# Patient Record
Sex: Female | Born: 1965
Health system: Southern US, Community
[De-identification: ages and names within clinical notes are randomized; demographics above are authoritative.]

## PROBLEM LIST (undated history)

## (undated) DIAGNOSIS — E7800 Pure hypercholesterolemia, unspecified: Secondary | ICD-10-CM

## (undated) DIAGNOSIS — I6523 Occlusion and stenosis of bilateral carotid arteries: Secondary | ICD-10-CM

## (undated) DIAGNOSIS — A6920 Lyme disease, unspecified: Secondary | ICD-10-CM

## (undated) DIAGNOSIS — I2510 Atherosclerotic heart disease of native coronary artery without angina pectoris: Secondary | ICD-10-CM

## (undated) HISTORY — PX: COLONOSCOPY: SHX174

## (undated) HISTORY — PX: WISDOM TOOTH EXTRACTION: SHX21

## (undated) HISTORY — DX: Occlusion and stenosis of bilateral carotid arteries: I65.23

## (undated) HISTORY — DX: Atherosclerotic heart disease of native coronary artery without angina pectoris: I25.10

## (undated) HISTORY — PX: TUBAL LIGATION: SHX77

## (undated) HISTORY — DX: Lyme disease, unspecified: A69.20

---

## 1998-11-30 ENCOUNTER — Other Ambulatory Visit: Admission: RE | Admit: 1998-11-30 | Discharge: 1998-11-30 | Payer: Self-pay | Admitting: Gynecology

## 2000-06-23 ENCOUNTER — Other Ambulatory Visit: Admission: RE | Admit: 2000-06-23 | Discharge: 2000-06-23 | Payer: Self-pay | Admitting: Gynecology

## 2001-12-20 ENCOUNTER — Other Ambulatory Visit: Admission: RE | Admit: 2001-12-20 | Discharge: 2001-12-20 | Payer: Self-pay | Admitting: Gynecology

## 2003-05-16 ENCOUNTER — Other Ambulatory Visit: Admission: RE | Admit: 2003-05-16 | Discharge: 2003-05-16 | Payer: Self-pay | Admitting: Gynecology

## 2004-11-23 ENCOUNTER — Ambulatory Visit: Payer: Self-pay | Admitting: Family Medicine

## 2005-03-14 ENCOUNTER — Emergency Department (HOSPITAL_COMMUNITY): Admission: EM | Admit: 2005-03-14 | Discharge: 2005-03-15 | Payer: Self-pay | Admitting: Emergency Medicine

## 2005-05-26 ENCOUNTER — Other Ambulatory Visit: Admission: RE | Admit: 2005-05-26 | Discharge: 2005-05-26 | Payer: Self-pay | Admitting: Gynecology

## 2005-10-24 ENCOUNTER — Ambulatory Visit: Payer: Self-pay | Admitting: Family Medicine

## 2005-12-01 ENCOUNTER — Ambulatory Visit: Payer: Self-pay | Admitting: Family Medicine

## 2008-05-24 ENCOUNTER — Emergency Department (HOSPITAL_COMMUNITY): Admission: EM | Admit: 2008-05-24 | Discharge: 2008-05-24 | Payer: Self-pay | Admitting: Family Medicine

## 2008-05-29 ENCOUNTER — Other Ambulatory Visit: Admission: RE | Admit: 2008-05-29 | Discharge: 2008-05-29 | Payer: Self-pay | Admitting: Gynecology

## 2008-05-29 ENCOUNTER — Encounter: Payer: Self-pay | Admitting: Women's Health

## 2008-05-29 ENCOUNTER — Ambulatory Visit: Payer: Self-pay | Admitting: Women's Health

## 2012-07-20 ENCOUNTER — Other Ambulatory Visit (HOSPITAL_COMMUNITY)
Admission: RE | Admit: 2012-07-20 | Discharge: 2012-07-20 | Disposition: A | Discharge status: Home / Self Care | Payer: BC Managed Care – PPO | Source: Ambulatory Visit | Attending: Women's Health | Admitting: Women's Health

## 2012-07-20 ENCOUNTER — Ambulatory Visit (INDEPENDENT_AMBULATORY_CARE_PROVIDER_SITE_OTHER): Payer: BC Managed Care – PPO | Admitting: Women's Health

## 2012-07-20 ENCOUNTER — Encounter: Payer: Self-pay | Admitting: Women's Health

## 2012-07-20 VITALS — BP 150/72 | Ht 63.0 in | Wt 132.0 lb

## 2012-07-20 DIAGNOSIS — Z01419 Encounter for gynecological examination (general) (routine) without abnormal findings: Secondary | ICD-10-CM | POA: Insufficient documentation

## 2012-07-20 DIAGNOSIS — A6920 Lyme disease, unspecified: Secondary | ICD-10-CM | POA: Insufficient documentation

## 2012-07-20 DIAGNOSIS — E079 Disorder of thyroid, unspecified: Secondary | ICD-10-CM

## 2012-07-20 DIAGNOSIS — Z833 Family history of diabetes mellitus: Secondary | ICD-10-CM

## 2012-07-20 DIAGNOSIS — Z1151 Encounter for screening for human papillomavirus (HPV): Secondary | ICD-10-CM | POA: Insufficient documentation

## 2012-07-20 DIAGNOSIS — F17200 Nicotine dependence, unspecified, uncomplicated: Secondary | ICD-10-CM

## 2012-07-20 DIAGNOSIS — F17210 Nicotine dependence, cigarettes, uncomplicated: Secondary | ICD-10-CM | POA: Insufficient documentation

## 2012-07-20 DIAGNOSIS — Z13220 Encounter for screening for lipoid disorders: Secondary | ICD-10-CM

## 2012-07-20 LAB — CBC WITH DIFFERENTIAL/PLATELET
Basophils Absolute: 0.1 10*3/uL (ref 0.0–0.1)
Basophils Relative: 1 % (ref 0–1)
Eosinophils Absolute: 0.2 10*3/uL (ref 0.0–0.7)
Eosinophils Relative: 2 % (ref 0–5)
HCT: 39.4 % (ref 36.0–46.0)
Hemoglobin: 13.7 g/dL (ref 12.0–15.0)
Lymphocytes Relative: 26 % (ref 12–46)
Lymphs Abs: 2.1 10*3/uL (ref 0.7–4.0)
MCH: 31.6 pg (ref 26.0–34.0)
MCHC: 34.8 g/dL (ref 30.0–36.0)
MCV: 90.8 fL (ref 78.0–100.0)
Monocytes Absolute: 0.5 10*3/uL (ref 0.1–1.0)
Monocytes Relative: 7 % (ref 3–12)
Neutro Abs: 5.1 10*3/uL (ref 1.7–7.7)
Neutrophils Relative %: 64 % (ref 43–77)
Platelets: 231 10*3/uL (ref 150–400)
RBC: 4.34 MIL/uL (ref 3.87–5.11)
RDW: 13.4 % (ref 11.5–15.5)
WBC: 7.9 10*3/uL (ref 4.0–10.5)

## 2012-07-20 LAB — LIPID PANEL
Cholesterol: 175 mg/dL (ref 0–200)
HDL: 60 mg/dL (ref >39)
LDL Cholesterol: 97 mg/dL (ref 0–99)
Total CHOL/HDL Ratio: 2.9 Ratio
Triglycerides: 90 mg/dL (ref <150)
VLDL: 18 mg/dL (ref 0–40)

## 2012-07-20 LAB — GLUCOSE, RANDOM: Glucose, Bld: 80 mg/dL (ref 70–99)

## 2012-07-20 LAB — TSH: TSH: 1.187 u[IU]/mL (ref 0.350–4.500)

## 2012-07-20 NOTE — Patient Instructions (Signed)
Vit d 1000 Health Maintenance, Females A healthy lifestyle and preventative care can promote health and wellness.  Maintain regular health, dental, and eye exams.  Eat a healthy diet. Foods like vegetables, fruits, whole grains, low-fat dairy products, and lean protein foods contain the nutrients you need without too many calories. Decrease your intake of foods high in solid fats, added sugars, and salt. Get information about a proper diet from your caregiver, if necessary.  Regular physical exercise is one of the most important things you can do for your health. Most adults should get at least 150 minutes of moderate-intensity exercise (any activity that increases your heart rate and causes you to sweat) each week. In addition, most adults need muscle-strengthening exercises on 2 or more days a week.   Maintain a healthy weight. The body mass index (BMI) is a screening tool to identify possible weight problems. It provides an estimate of body fat based on height and weight. Your caregiver can help determine your BMI, and can help you achieve or maintain a healthy weight. For adults 20 years and older:  A BMI below 18.5 is considered underweight.  A BMI of 18.5 to 24.9 is normal.  A BMI of 25 to 29.9 is considered overweight.  A BMI of 30 and above is considered obese.  Maintain normal blood lipids and cholesterol by exercising and minimizing your intake of saturated fat. Eat a balanced diet with plenty of fruits and vegetables. Blood tests for lipids and cholesterol should begin at age 74 and be repeated every 5 years. If your lipid or cholesterol levels are high, you are over 50, or you are a high risk for heart disease, you may need your cholesterol levels checked more frequently.Ongoing high lipid and cholesterol levels should be treated with medicines if diet and exercise are not effective.  If you smoke, find out from your caregiver how to quit. If you do not use tobacco, do not  start.  If you are pregnant, do not drink alcohol. If you are breastfeeding, be very cautious about drinking alcohol. If you are not pregnant and choose to drink alcohol, do not exceed 1 drink per day. One drink is considered to be 12 ounces (355 mL) of beer, 5 ounces (148 mL) of wine, or 1.5 ounces (44 mL) of liquor.  Avoid use of street drugs. Do not share needles with anyone. Ask for help if you need support or instructions about stopping the use of drugs.  High blood pressure causes heart disease and increases the risk of stroke. Blood pressure should be checked at least every 1 to 2 years. Ongoing high blood pressure should be treated with medicines, if weight loss and exercise are not effective.  If you are 31 to 47 years old, ask your caregiver if you should take aspirin to prevent strokes.  Diabetes screening involves taking a blood sample to check your fasting blood sugar level. This should be done once every 3 years, after age 43, if you are within normal weight and without risk factors for diabetes. Testing should be considered at a younger age or be carried out more frequently if you are overweight and have at least 1 risk factor for diabetes.  Breast cancer screening is essential preventative care for women. You should practice "breast self-awareness." This means understanding the normal appearance and feel of your breasts and may include breast self-examination. Any changes detected, no matter how small, should be reported to a caregiver. Women in their 31s and  should have a clinical breast exam (CBE) by a caregiver as part of a regular health exam every 1 to 3 years. After age 40, women should have a CBE every year. Starting at age 40, women should consider having a mammogram (breast X-ray) every year. Women who have a family history of breast cancer should talk to their caregiver about genetic screening. Women at a high risk of breast cancer should talk to their caregiver about having  an MRI and a mammogram every year.  The Pap test is a screening test for cervical cancer. Women should have a Pap test starting at age 21. Between ages 21 and 29, Pap tests should be repeated every 2 years. Beginning at age 30, you should have a Pap test every 3 years as long as the past 3 Pap tests have been normal. If you had a hysterectomy for a problem that was not cancer or a condition that could lead to cancer, then you no longer need Pap tests. If you are between ages 65 and 70, and you have had normal Pap tests going back 10 years, you no longer need Pap tests. If you have had past treatment for cervical cancer or a condition that could lead to cancer, you need Pap tests and screening for cancer for at least 20 years after your treatment. If Pap tests have been discontinued, risk factors (such as a new sexual partner) need to be reassessed to determine if screening should be resumed. Some women have medical problems that increase the chance of getting cervical cancer. In these cases, your caregiver may recommend more frequent screening and Pap tests.  The human papillomavirus (HPV) test is an additional test that may be used for cervical cancer screening. The HPV test looks for the virus that can cause the cell changes on the cervix. The cells collected during the Pap test can be tested for HPV. The HPV test could be used to screen women aged 30 years and older, and should be used in women of any age who have unclear Pap test results. After the age of 30, women should have HPV testing at the same frequency as a Pap test.  Colorectal cancer can be detected and often prevented. Most routine colorectal cancer screening begins at the age of 50 and continues through age 75. However, your caregiver may recommend screening at an earlier age if you have risk factors for colon cancer. On a yearly basis, your caregiver may provide home test kits to check for hidden blood in the stool. Use of a small camera at  the end of a tube, to directly examine the colon (sigmoidoscopy or colonoscopy), can detect the earliest forms of colorectal cancer. Talk to your caregiver about this at age 50, when routine screening begins. Direct examination of the colon should be repeated every 5 to 10 years through age 75, unless early forms of pre-cancerous polyps or small growths are found.  Hepatitis C blood testing is recommended for all people born from 1945 through 1965 and any individual with known risks for hepatitis C.  Practice safe sex. Use condoms and avoid high-risk sexual practices to reduce the spread of sexually transmitted infections (STIs). Sexually active women aged 25 and younger should be checked for Chlamydia, which is a common sexually transmitted infection. Older women with new or multiple partners should also be tested for Chlamydia. Testing for other STIs is recommended if you are sexually active and at increased risk.  Osteoporosis is a disease in   which the bones lose minerals and strength with aging. This can result in serious bone fractures. The risk of osteoporosis can be identified using a bone density scan. Women ages 65 and over and women at risk for fractures or osteoporosis should discuss screening with their caregivers. Ask your caregiver whether you should be taking a calcium supplement or vitamin D to reduce the rate of osteoporosis.  Menopause can be associated with physical symptoms and risks. Hormone replacement therapy is available to decrease symptoms and risks. You should talk to your caregiver about whether hormone replacement therapy is right for you.  Use sunscreen with a sun protection factor (SPF) of 30 or greater. Apply sunscreen liberally and repeatedly throughout the day. You should seek shade when your shadow is shorter than you. Protect yourself by wearing long sleeves, pants, a wide-brimmed hat, and sunglasses year round, whenever you are outdoors.  Notify your caregiver of new  moles or changes in moles, especially if there is a change in shape or color. Also notify your caregiver if a mole is larger than the size of a pencil eraser.  Stay current with your immunizations. Document Released: 03/21/2011 Document Revised: 11/28/2011 Document Reviewed: 03/21/2011 Exit[employer] Patient Information 2013 [employer].  

## 2012-07-20 NOTE — Progress Notes (Signed)
Robin Bowers [birth year] [id number]    History:    The patient presents for annual exam.  Monthly 6 day cycle/BTL/not sexually active. Divorced, no infidelity. History of normal Paps. Last Pap 2009. Last mammogram several years ago at a screening was without insurance, reports normal. Smoker.   Past medical history, past surgical history, family history and social history were all reviewed and documented in the EPIC chart. Daughter Robin Bowers 20,  Robin Bowers 16, both doing well and  had gardasil. Has had counseling currently on Wellbutrin per primary care. Has horses.   ROS:  A  ROS was performed and pertinent positives and negatives are included in the history.  Exam:  Filed Vitals:   07/20/12 0801  BP: 150/72    General appearance:  Normal Head/Neck:  Normal, without cervical or supraclavicular adenopathy. Thyroid:  Symmetrical, normal in size, without palpable masses or nodularity. Respiratory  Effort:  Normal  Auscultation:  Clear without wheezing or rhonchi Cardiovascular  Auscultation:  Regular rate, without rubs, murmurs or gallops  Edema/varicosities:  Not grossly evident Abdominal  Soft,nontender, without masses, guarding or rebound.  Liver/spleen:  No organomegaly noted  Hernia:  None appreciated  Skin  Inspection:  Grossly normal  Palpation:  Grossly normal Neurologic/psychiatric  Orientation:  Normal with appropriate conversation.  Mood/affect:  Normal  Genitourinary    Breasts: Examined lying and sitting.     Right: Without masses, retractions, discharge or axillary adenopathy.     Left: Without masses, retractions, discharge or axillary adenopathy.   Inguinal/mons:  Normal without inguinal adenopathy  External genitalia:  Normal  BUS/Urethra/Skene's glands:  Normal  Bladder:  Normal  Vagina:  Normal  Cervix:  Normal  Uterus:   normal in size, shape and contour.  Midline and mobile  Adnexa/parametria:     Rt: Without masses or tenderness.   Lt: Without  masses or tenderness.  Anus and perineum: Normal  Digital rectal exam: Normal sphincter tone without palpated masses or tenderness  Assessment/Plan:  46 y.o. DWF G2P2 for annual exam without complaint.  Normal GYN exam/BTL with minimal healthcare in past few years Situational anxiety/depression-Wellbutrin primary care Lyme disease primary care managing Smoker half pack per day  Plan: SBE's, schedule mammogram, exercise, calcium rich diet and vitamin D 1000 daily encouraged. Reviewed importance of no smoking or decreasing for health. Continue care with primary care for current treatment of Lyme disease and situational anxiety/depression. Blood pressure 150/70 reports normal blood pressure primary care will check away from office and followup as needed. CBC, glucose, lipid panel, UA, Pap. New screening guidelines reviewed. Condoms encouraged if becomes sexually active.    Huel Cote [facility], 8:48 AM 07/20/2012

## 2012-07-21 LAB — URINALYSIS W MICROSCOPIC + REFLEX CULTURE
Bacteria, UA: NONE SEEN
Bilirubin Urine: NEGATIVE
Casts: NONE SEEN
Crystals: NONE SEEN
Glucose, UA: NEGATIVE mg/dL
Hgb urine dipstick: NEGATIVE
Ketones, ur: NEGATIVE mg/dL
Leukocytes, UA: NEGATIVE
Nitrite: NEGATIVE
Protein, ur: NEGATIVE mg/dL
Specific Gravity, Urine: 1.008 (ref 1.005–1.030)
Urobilinogen, UA: 0.2 mg/dL (ref 0.0–1.0)
pH: 5.5 (ref 5.0–8.0)

## 2012-07-27 ENCOUNTER — Encounter: Payer: Self-pay | Admitting: Gynecology

## 2013-10-02 ENCOUNTER — Ambulatory Visit (INDEPENDENT_AMBULATORY_CARE_PROVIDER_SITE_OTHER): Payer: BC Managed Care – PPO | Admitting: Women's Health

## 2013-10-02 ENCOUNTER — Encounter: Payer: Self-pay | Admitting: Women's Health

## 2013-10-02 VITALS — BP 132/70 | Ht 63.0 in | Wt 132.2 lb

## 2013-10-02 DIAGNOSIS — Z113 Encounter for screening for infections with a predominantly sexual mode of transmission: Secondary | ICD-10-CM

## 2013-10-02 DIAGNOSIS — N912 Amenorrhea, unspecified: Secondary | ICD-10-CM

## 2013-10-02 DIAGNOSIS — Z01419 Encounter for gynecological examination (general) (routine) without abnormal findings: Secondary | ICD-10-CM

## 2013-10-02 DIAGNOSIS — Z833 Family history of diabetes mellitus: Secondary | ICD-10-CM

## 2013-10-02 LAB — CBC WITH DIFFERENTIAL/PLATELET
Basophils Absolute: 0 10*3/uL (ref 0.0–0.1)
Basophils Relative: 0 % (ref 0–1)
Eosinophils Absolute: 0.1 10*3/uL (ref 0.0–0.7)
Eosinophils Relative: 1 % (ref 0–5)
HCT: 42.7 % (ref 36.0–46.0)
Hemoglobin: 14.6 g/dL (ref 12.0–15.0)
Lymphocytes Relative: 27 % (ref 12–46)
Lymphs Abs: 2.5 10*3/uL (ref 0.7–4.0)
MCH: 31.5 pg (ref 26.0–34.0)
MCHC: 34.2 g/dL (ref 30.0–36.0)
MCV: 92.2 fL (ref 78.0–100.0)
Monocytes Absolute: 0.5 10*3/uL (ref 0.1–1.0)
Monocytes Relative: 5 % (ref 3–12)
Neutro Abs: 6.1 10*3/uL (ref 1.7–7.7)
Neutrophils Relative %: 67 % (ref 43–77)
Platelets: 214 10*3/uL (ref 150–400)
RBC: 4.63 MIL/uL (ref 3.87–5.11)
RDW: 13.4 % (ref 11.5–15.5)
WBC: 9.2 10*3/uL (ref 4.0–10.5)

## 2013-10-02 LAB — FOLLICLE STIMULATING HORMONE: FSH: 55.1 m[IU]/mL

## 2013-10-02 LAB — TSH: TSH: 1.049 u[IU]/mL (ref 0.350–4.500)

## 2013-10-02 LAB — GLUCOSE, RANDOM: Glucose, Bld: 89 mg/dL (ref 70–99)

## 2013-10-02 NOTE — Patient Instructions (Signed)
Health Recommendations for Postmenopausal Women Respected and ongoing research has looked at the most common causes of death, disability, and poor quality of life in postmenopausal women. The causes include heart disease, diseases of blood vessels, diabetes, depression, cancer, and bone loss (osteoporosis). Many things can be done to help lower the chances of developing these and other common problems: CARDIOVASCULAR DISEASE Heart Disease: A heart attack is a medical emergency. Know the signs and symptoms of a heart attack. Below are things women can do to reduce their risk for heart disease.   Do not smoke. If you smoke, quit.  Aim for a healthy weight. Being overweight causes many preventable deaths. Eat a healthy and balanced diet and drink an adequate amount of liquids.  Get moving. Make a commitment to be more physically active. Aim for 30 minutes of activity on most, if not all days of the week.  Eat for heart health. Choose a diet that is low in saturated fat and cholesterol and eliminate trans fat. Include whole grains, vegetables, and fruits. Read and understand the labels on food containers before buying.  Know your numbers. Ask your caregiver to check your blood pressure, cholesterol (total, HDL, LDL, triglycerides) and blood glucose. Work with your caregiver on improving your entire clinical picture.  High blood pressure. Limit or stop your table salt intake (try salt substitute and food seasonings). Avoid salty foods and drinks. Read labels on food containers before buying. Eating well and exercising can help control high blood pressure. STROKE  Stroke is a medical emergency. Stroke may be the result of a blood clot in a blood vessel in the brain or by a brain hemorrhage (bleeding). Know the signs and symptoms of a stroke. To lower the risk of developing a stroke:  Avoid fatty foods.  Quit smoking.  Control your diabetes, blood pressure, and irregular heart rate. THROMBOPHLEBITIS  (BLOOD CLOT) OF THE LEG  Becoming overweight and leading a stationary lifestyle may also contribute to developing blood clots. Controlling your diet and exercising will help lower the risk of developing blood clots. CANCER SCREENING  Breast Cancer: Take steps to reduce your risk of breast cancer.  You should practice "breast self-awareness." This means understanding the normal appearance and feel of your breasts and should include breast self-examination. Any changes detected, no matter how small, should be reported to your caregiver.  After age 40, you should have a clinical breast exam (CBE) every year.  Starting at age 40, you should consider having a mammogram (breast X-ray) every year.  If you have a family history of breast cancer, talk to your caregiver about genetic screening.  If you are at high risk for breast cancer, talk to your caregiver about having an MRI and a mammogram every year.  Intestinal or Stomach Cancer: Tests to consider are a rectal exam, fecal occult blood, sigmoidoscopy, and colonoscopy. Women who are high risk may need to be screened at an earlier age and more often.  Cervical Cancer:  Beginning at age 30, you should have a Pap test every 3 years as long as the past 3 Pap tests have been normal.  If you have had past treatment for cervical cancer or a condition that could lead to cancer, you need Pap tests and screening for cancer for at least 20 years after your treatment.  If you had a hysterectomy for a problem that was not cancer or a condition that could lead to cancer, then you no longer need Pap tests.    If you are between ages 65 and 70, and you have had normal Pap tests going back 10 years, you no longer need Pap tests.  If Pap tests have been discontinued, risk factors (such as a new sexual partner) need to be reassessed to determine if screening should be resumed.  Some medical problems can increase the chance of getting cervical cancer. In these  cases, your caregiver may recommend more frequent screening and Pap tests.  Uterine Cancer: If you have vaginal bleeding after reaching menopause, you should notify your caregiver.  Ovarian cancer: Other than yearly pelvic exams, there are no reliable tests available to screen for ovarian cancer at this time except for yearly pelvic exams.  Lung Cancer: Yearly chest X-rays can detect lung cancer and should be done on high risk women, such as cigarette smokers and women with chronic lung disease (emphysema).  Skin Cancer: A complete body skin exam should be done at your yearly examination. Avoid overexposure to the sun and ultraviolet light lamps. Use a strong sun block cream when in the sun. All of these things are important in lowering the risk of skin cancer. MENOPAUSE Menopause Symptoms: Hormone therapy products are effective for treating symptoms associated with menopause:  Moderate to severe hot flashes.  Night sweats.  Mood swings.  Headaches.  Tiredness.  Loss of sex drive.  Insomnia.  Other symptoms. Hormone replacement carries certain risks, especially in older women. Women who use or are thinking about using estrogen or estrogen with progestin treatments should discuss that with their caregiver. Your caregiver will help you understand the benefits and risks. The ideal dose of hormone replacement therapy is not known. The Food and Drug Administration (FDA) has concluded that hormone therapy should be used only at the lowest doses and for the shortest amount of time to reach treatment goals.  OSTEOPOROSIS Protecting Against Bone Loss and Preventing Fracture: If you use hormone therapy for prevention of bone loss (osteoporosis), the risks for bone loss must outweigh the risk of the therapy. Ask your caregiver about other medications known to be safe and effective for preventing bone loss and fractures. To guard against bone loss or fractures, the following is recommended:  If  you are less than age 50, take 1000 mg of calcium and at least 600 mg of Vitamin D per day.  If you are greater than age 50 but less than age 70, take 1200 mg of calcium and at least 600 mg of Vitamin D per day.  If you are greater than age 70, take 1200 mg of calcium and at least 800 mg of Vitamin D per day. Smoking and excessive alcohol intake increases the risk of osteoporosis. Eat foods rich in calcium and vitamin D and do weight bearing exercises several times a week as your caregiver suggests. DIABETES Diabetes Melitus: If you have Type I or Type 2 diabetes, you should keep your blood sugar under control with diet, exercise and recommended medication. Avoid too many sweets, starchy and fatty foods. Being overweight can make control more difficult. COGNITION AND MEMORY Cognition and Memory: Menopausal hormone therapy is not recommended for the prevention of cognitive disorders such as Alzheimer's disease or memory loss.  DEPRESSION  Depression may occur at any age, but is common in elderly women. The reasons may be because of physical, medical, social (loneliness), or financial problems and needs. If you are experiencing depression because of medical problems and control of symptoms, talk to your caregiver about this. Physical activity and   exercise may help with mood and sleep. Community and volunteer involvement may help your sense of value and worth. If you have depression and you feel that the problem is getting worse or becoming severe, talk to your caregiver about treatment options that are best for you. ACCIDENTS  Accidents are common and can be serious in the elderly woman. Prepare your house to prevent accidents. Eliminate throw rugs, place hand bars in the bath, shower and toilet areas. Avoid wearing high heeled shoes or walking on wet, snowy, and icy areas. Limit or stop driving if you have vision or hearing problems, or you feel you are unsteady with you movements and  reflexes. HEPATITIS C Hepatitis C is a type of viral infection affecting the liver. It is spread mainly through contact with blood from an infected person. It can be treated, but if left untreated, it can lead to severe liver damage over years. Many people who are infected do not know that the virus is in their blood. If you are a "baby-boomer", it is recommended that you have one screening test for Hepatitis C. IMMUNIZATIONS  Several immunizations are important to consider having during your senior years, including:   Tetanus, diptheria, and pertussis booster shot.  Influenza every year before the flu season begins.  Pneumonia vaccine.  Shingles vaccine.  Others as indicated based on your specific needs. Talk to your caregiver about these. Document Released: 10/28/2005 Document Revised: 08/22/2012 Document Reviewed: 06/23/2008 ExitCare Patient Information 2014 [employer].  

## 2013-10-02 NOTE — Progress Notes (Signed)
Robin Bowers Feb 02, 1966 [id number]    History:    The patient presents for annual exam with complaint of amenorrhea. Last monthly cycle April 2014. Denies spotting/discharge. BTL/new partner/condoms.  Smoker. Tried Wellbutrin, Chantix, nicotine patches in past without success. Experiencing some menopausal symptoms.   History of Lyme disease, managed by primary care.  Past medical history, past surgical history, family history and social history were all reviewed and documented in the EPIC chart. Daughter 86 (concerned about relationship), Son 71 doing well. Father deceased colon cancer early 28's, mother HTN, stroke, Alz. Has horses.   ROS:  A  ROS was performed and pertinent positives and negatives are included in the history.  Exam:  Filed Vitals:   10/02/13 0838  BP: 132/70    General appearance:  Normal Head/Neck:  Normal, without cervical or supraclavicular adenopathy. Thyroid:  Symmetrical, normal in size, without palpable masses or nodularity. Respiratory  Effort:  Normal  Auscultation:  Clear without wheezing or rhonchi Cardiovascular  Auscultation:  Regular rate, without rubs, murmurs or gallops  Edema/varicosities:  Not grossly evident Abdominal  Soft,nontender, without masses, guarding or rebound.  Liver/spleen:  No organomegaly noted  Hernia:  None appreciated  Skin  Inspection:  Grossly normal  Palpation:  Grossly normal Neurologic/psychiatric  Orientation:  Normal with appropriate conversation.  Mood/affect:  Normal  Genitourinary    Breasts: Examined lying and sitting.     Right: Without masses, retractions, discharge or axillary adenopathy.     Left: Without masses, retractions, discharge or axillary adenopathy.   Inguinal/mons:  Normal without inguinal adenopathy  External genitalia:  Normal  BUS/Urethra/Skene's glands:  Normal  Bladder:  Normal  Vagina:  Normal  Cervix:  Normal  Uterus:  Normal in size, shape and contour.  Midline and  mobile  Adnexa/parametria:     Rt: Without masses or tenderness.   Lt: Without masses or tenderness.  Anus and perineum: Normal  Digital rectal exam: Normal sphincter tone without palpated masses or tenderness  Assessment/Plan:  48 y.o. DWF G2P2 for annual exam.  Annual exam with  Amenorrhea/BTL New partner/STD screen Lyme Disease/managed primary care Smoker  Plan: CBC, glucose, FSH, TSH, UA, GC/Chlamydia. Declines need for HIV, hepatitis or RPR will continue condoms. Normal pap history, last pap 07/2012. New screening guidelines reviewed.  SBE's, schedule mammogram/overdue, exercise, calcium rich diet and vitamin D 2000 daily encouraged. Suggested Vit E for occasional hot flushes/sleeping difficulties. Declines HRT. Reviewed importance of no smoking or decreasing for health. Continue care with primary care for current treatment of Lyme disease.  Marland KitchenHuel Cote [facility], 9:13 AM 10/02/2013

## 2013-10-03 LAB — URINALYSIS W MICROSCOPIC + REFLEX CULTURE
Bacteria, UA: NONE SEEN
Bilirubin Urine: NEGATIVE
Casts: NONE SEEN
Crystals: NONE SEEN
Glucose, UA: NEGATIVE mg/dL
Hgb urine dipstick: NEGATIVE
Ketones, ur: NEGATIVE mg/dL
Leukocytes, UA: NEGATIVE
Nitrite: NEGATIVE
Protein, ur: NEGATIVE mg/dL
Specific Gravity, Urine: 1.014 (ref 1.005–1.030)
Squamous Epithelial / HPF: NONE SEEN
Urobilinogen, UA: 0.2 mg/dL (ref 0.0–1.0)
pH: 7 (ref 5.0–8.0)

## 2013-10-03 LAB — GC/CHLAMYDIA PROBE AMP
CT Probe RNA: NEGATIVE
GC Probe RNA: NEGATIVE

## 2014-07-21 ENCOUNTER — Encounter: Payer: Self-pay | Admitting: Women's Health

## 2016-09-05 ENCOUNTER — Other Ambulatory Visit: Payer: Self-pay | Admitting: Obstetrics and Gynecology

## 2016-09-05 DIAGNOSIS — Z1231 Encounter for screening mammogram for malignant neoplasm of breast: Secondary | ICD-10-CM

## 2016-09-22 ENCOUNTER — Ambulatory Visit (HOSPITAL_COMMUNITY)
Admission: RE | Admit: 2016-09-22 | Discharge: 2016-09-22 | Disposition: A | Discharge status: Home / Self Care | Payer: Self-pay | Source: Ambulatory Visit | Attending: Obstetrics and Gynecology | Admitting: Obstetrics and Gynecology

## 2016-09-22 ENCOUNTER — Encounter (HOSPITAL_COMMUNITY): Payer: Self-pay

## 2016-09-22 ENCOUNTER — Ambulatory Visit
Admission: RE | Admit: 2016-09-22 | Discharge: 2016-09-22 | Disposition: A | Discharge status: Home / Self Care | Payer: No Typology Code available for payment source | Source: Ambulatory Visit | Attending: Obstetrics and Gynecology | Admitting: Obstetrics and Gynecology

## 2016-09-22 VITALS — BP 124/86 | Temp 98.6°F | Ht 64.0 in | Wt 141.0 lb

## 2016-09-22 DIAGNOSIS — Z1231 Encounter for screening mammogram for malignant neoplasm of breast: Secondary | ICD-10-CM

## 2016-09-22 DIAGNOSIS — Z01419 Encounter for gynecological examination (general) (routine) without abnormal findings: Secondary | ICD-10-CM

## 2016-09-22 NOTE — Patient Instructions (Addendum)
Explained breast self awareness to [name]. Let patient know that if today's Pap smear is normal that her next Pap smear will be due in 3 years. Referred patient to the [facility] for a screening mammogram. Appointment scheduled for Thursday, September 22, 2016 at 1040. Let patient know will follow up with her within the next couple weeks with results of Pap smear by phone. Informed patient that [facility] will follow up with her within the next couple of weeks with results of mammogram by letter or phone. Discussed smoking cessation with patient. Referred to the [facility] Quitline and gave resources to free smoking cessation at [facility]. Robin Bowers verbalized understanding.  [name], [name], RN 1:37 PM

## 2016-09-22 NOTE — Progress Notes (Addendum)
No complaints today.   Pap Smear: Pap smear completed today. Last Pap smear was 07/20/2012 at [facility] and normal. Per patient has a history of an abnormal Pap smear 10 years ago that a colposcopy was completed for follow up. Per patient she has had only two Pap smears since colposcopy and both have been normal. Last Pap smear result is in EPIC.  Physical exam: Breasts Breasts symmetrical. No skin abnormalities bilateral breasts. No nipple retraction bilateral breasts. No nipple discharge bilateral breasts. No lymphadenopathy. No lumps palpated bilateral breasts. No complaints of pain or tenderness on exam. Referred patient to the [facility] for a screening mammogram. Appointment scheduled for Thursday, September 22, 2016 at 1040.  Pelvic/Bimanual   Ext Genitalia No lesions, no swelling and no discharge observed on external genitalia.         Vagina Vagina pink and normal texture. No lesions or discharge observed in vagina.          Cervix Cervix is present. Cervix pink and of normal texture. No discharge observed.     Uterus Uterus is present and palpable. Uterus in normal position and normal size.        Adnexae Bilateral ovaries present and palpable. No tenderness on palpation.          Rectovaginal No rectal exam completed today since patient had no rectal complaints. No skin abnormalities observed on exam.    Smoking History: Patient is a current smoker. Discussed smoking cessation with patient. Referred to the [facility] Quitline and gave resources to free smoking cessation at [facility].  Patient Navigation: Patient education provided. Access to services provided for patient through [facility] program.   Colorectal Cancer Screening: Per patient has never had a colonoscopy completed. No complaints today.

## 2016-09-22 NOTE — Addendum Note (Signed)
Encounter addended by: Loletta Parish, RN on: 09/22/2016  2:03 PM<BR>    Actions taken: Sign clinical note

## 2016-09-26 ENCOUNTER — Encounter (HOSPITAL_COMMUNITY): Payer: Self-pay | Admitting: *Deleted

## 2016-10-03 ENCOUNTER — Encounter (HOSPITAL_COMMUNITY): Payer: Self-pay | Admitting: *Deleted

## 2016-10-03 ENCOUNTER — Telehealth (HOSPITAL_COMMUNITY): Payer: Self-pay | Admitting: *Deleted

## 2016-10-03 NOTE — Telephone Encounter (Signed)
Telephoned patient at home number. Advised patient spoke with physician and would just need repeat pap smear in five years. Patient voiced understanding.

## 2017-10-02 DIAGNOSIS — L570 Actinic keratosis: Secondary | ICD-10-CM | POA: Diagnosis not present

## 2017-10-02 DIAGNOSIS — L821 Other seborrheic keratosis: Secondary | ICD-10-CM | POA: Diagnosis not present

## 2017-10-02 DIAGNOSIS — D229 Melanocytic nevi, unspecified: Secondary | ICD-10-CM | POA: Diagnosis not present

## 2017-10-19 DIAGNOSIS — M545 Low back pain: Secondary | ICD-10-CM | POA: Diagnosis not present

## 2017-10-19 DIAGNOSIS — M461 Sacroiliitis, not elsewhere classified: Secondary | ICD-10-CM | POA: Diagnosis not present

## 2018-01-09 ENCOUNTER — Other Ambulatory Visit: Payer: Self-pay | Admitting: Women's Health

## 2018-01-09 DIAGNOSIS — Z1231 Encounter for screening mammogram for malignant neoplasm of breast: Secondary | ICD-10-CM

## 2018-01-29 ENCOUNTER — Ambulatory Visit: Payer: No Typology Code available for payment source

## 2018-02-02 ENCOUNTER — Ambulatory Visit
Admission: RE | Admit: 2018-02-02 | Discharge: 2018-02-02 | Disposition: A | Discharge status: Home / Self Care | Payer: 59 | Source: Ambulatory Visit | Attending: Women's Health | Admitting: Women's Health

## 2018-02-02 DIAGNOSIS — Z1231 Encounter for screening mammogram for malignant neoplasm of breast: Secondary | ICD-10-CM

## 2018-02-26 DIAGNOSIS — M1711 Unilateral primary osteoarthritis, right knee: Secondary | ICD-10-CM | POA: Diagnosis not present

## 2018-02-26 DIAGNOSIS — M25562 Pain in left knee: Secondary | ICD-10-CM | POA: Diagnosis not present

## 2018-02-26 DIAGNOSIS — M25561 Pain in right knee: Secondary | ICD-10-CM | POA: Diagnosis not present

## 2018-02-26 DIAGNOSIS — M1712 Unilateral primary osteoarthritis, left knee: Secondary | ICD-10-CM | POA: Diagnosis not present

## 2018-03-14 ENCOUNTER — Ambulatory Visit: Payer: 59 | Admitting: Women's Health

## 2018-03-14 ENCOUNTER — Encounter: Payer: Self-pay | Admitting: Women's Health

## 2018-03-14 VITALS — BP 122/78 | Ht 63.0 in | Wt 141.0 lb

## 2018-03-14 DIAGNOSIS — Z13220 Encounter for screening for lipoid disorders: Secondary | ICD-10-CM

## 2018-03-14 DIAGNOSIS — Z01419 Encounter for gynecological examination (general) (routine) without abnormal findings: Secondary | ICD-10-CM | POA: Diagnosis not present

## 2018-03-14 LAB — COMPREHENSIVE METABOLIC PANEL
AG Ratio: 1.6 (calc) (ref 1.0–2.5)
ALT: 13 U/L (ref 6–29)
AST: 15 U/L (ref 10–35)
Albumin: 4.2 g/dL (ref 3.6–5.1)
Alkaline phosphatase (APISO): 90 U/L (ref 33–130)
BUN: 11 mg/dL (ref 7–25)
CO2: 26 mmol/L (ref 20–32)
Calcium: 9.4 mg/dL (ref 8.6–10.4)
Chloride: 105 mmol/L (ref 98–110)
Creat: 0.86 mg/dL (ref 0.50–1.05)
Globulin: 2.6 g/dL (calc) (ref 1.9–3.7)
Glucose, Bld: 103 mg/dL — ABNORMAL HIGH (ref 65–99)
Potassium: 4.5 mmol/L (ref 3.5–5.3)
Sodium: 140 mmol/L (ref 135–146)
Total Bilirubin: 0.6 mg/dL (ref 0.2–1.2)
Total Protein: 6.8 g/dL (ref 6.1–8.1)

## 2018-03-14 LAB — CBC WITH DIFFERENTIAL/PLATELET
Basophils Absolute: 56 cells/uL (ref 0–200)
Basophils Relative: 0.4 %
Eosinophils Absolute: 127 cells/uL (ref 15–500)
Eosinophils Relative: 0.9 %
HCT: 41.3 % (ref 35.0–45.0)
Hemoglobin: 14.3 g/dL (ref 11.7–15.5)
Lymphs Abs: 2919 cells/uL (ref 850–3900)
MCH: 30.7 pg (ref 27.0–33.0)
MCHC: 34.6 g/dL (ref 32.0–36.0)
MCV: 88.6 fL (ref 80.0–100.0)
MPV: 11.6 fL (ref 7.5–12.5)
Monocytes Relative: 3.8 %
Neutro Abs: 10462 cells/uL — ABNORMAL HIGH (ref 1500–7800)
Neutrophils Relative %: 74.2 %
Platelets: 219 10*3/uL (ref 140–400)
RBC: 4.66 10*6/uL (ref 3.80–5.10)
RDW: 13.3 % (ref 11.0–15.0)
Total Lymphocyte: 20.7 %
WBC mixed population: 536 cells/uL (ref 200–950)
WBC: 14.1 10*3/uL — ABNORMAL HIGH (ref 3.8–10.8)

## 2018-03-14 LAB — LIPID PANEL
Cholesterol: 241 mg/dL — ABNORMAL HIGH (ref <200)
HDL: 54 mg/dL (ref >50)
LDL Cholesterol (Calc): 162 mg/dL (calc) — ABNORMAL HIGH
Non-HDL Cholesterol (Calc): 187 mg/dL (calc) — ABNORMAL HIGH (ref <130)
Total CHOL/HDL Ratio: 4.5 (calc) (ref <5.0)
Triglycerides: 130 mg/dL (ref <150)

## 2018-03-14 NOTE — Patient Instructions (Signed)
Colonoscopy  Dr Gessner  [phone]  Health Maintenance for Postmenopausal Women Menopause is a normal process in which your reproductive ability comes to an end. This process happens gradually over a span of months to years, usually between the ages of 48 and 55. Menopause is complete when you have missed 12 consecutive menstrual periods. It is important to talk with your health care provider about some of the most common conditions that affect postmenopausal women, such as heart disease, cancer, and bone loss (osteoporosis). Adopting a healthy lifestyle and getting preventive care can help to promote your health and wellness. Those actions can also lower your chances of developing some of these common conditions. What should I know about menopause? During menopause, you may experience a number of symptoms, such as:  Moderate-to-severe hot flashes.  Night sweats.  Decrease in sex drive.  Mood swings.  Headaches.  Tiredness.  Irritability.  Memory problems.  Insomnia.  Choosing to treat or not to treat menopausal changes is an individual decision that you make with your health care provider. What should I know about hormone replacement therapy and supplements? Hormone therapy products are effective for treating symptoms that are associated with menopause, such as hot flashes and night sweats. Hormone replacement carries certain risks, especially as you become older. If you are thinking about using estrogen or estrogen with progestin treatments, discuss the benefits and risks with your health care provider. What should I know about heart disease and stroke? Heart disease, heart attack, and stroke become more likely as you age. This may be due, in part, to the hormonal changes that your body experiences during menopause. These can affect how your body processes dietary fats, triglycerides, and cholesterol. Heart attack and stroke are both medical emergencies. There are many things that you  can do to help prevent heart disease and stroke:  Have your blood pressure checked at least every 1-2 years. High blood pressure causes heart disease and increases the risk of stroke.  If you are 55-79 years old, ask your health care provider if you should take aspirin to prevent a heart attack or a stroke.  Do not use any tobacco products, including cigarettes, chewing tobacco, or electronic cigarettes. If you need help quitting, ask your health care provider.  It is important to eat a healthy diet and maintain a healthy weight. ? Be sure to include plenty of vegetables, fruits, low-fat dairy products, and lean protein. ? Avoid eating foods that are high in solid fats, added sugars, or salt (sodium).  Get regular exercise. This is one of the most important things that you can do for your health. ? Try to exercise for at least 150 minutes each week. The type of exercise that you do should increase your heart rate and make you sweat. This is known as moderate-intensity exercise. ? Try to do strengthening exercises at least twice each week. Do these in addition to the moderate-intensity exercise.  Know your numbers.Ask your health care provider to check your cholesterol and your blood glucose. Continue to have your blood tested as directed by your health care provider.  What should I know about cancer screening? There are several types of cancer. Take the following steps to reduce your risk and to catch any cancer development as early as possible. Breast Cancer  Practice breast self-awareness. ? This means understanding how your breasts normally appear and feel. ? It also means doing regular breast self-exams. Let your health care provider know about any changes, no matter   matter how small.  If you are 74 or older, have a clinician do a breast exam (clinical breast exam or CBE) every year. Depending on your age, family history, and medical history, it may be recommended that you also have a  yearly breast X-ray (mammogram).  If you have a family history of breast cancer, talk with your health care provider about genetic screening.  If you are at high risk for breast cancer, talk with your health care provider about having an MRI and a mammogram every year.  Breast cancer (BRCA) gene test is recommended for women who have family members with BRCA-related cancers. Results of the assessment will determine the need for genetic counseling and BRCA1 and for BRCA2 testing. BRCA-related cancers include these types: ? Breast. This occurs in males or females. ? Ovarian. ? Tubal. This may also be called fallopian tube cancer. ? Cancer of the abdominal or pelvic lining (peritoneal cancer). ? Prostate. ? Pancreatic.  Cervical, Uterine, and Ovarian Cancer Your health care provider may recommend that you be screened regularly for cancer of the pelvic organs. These include your ovaries, uterus, and vagina. This screening involves a pelvic exam, which includes checking for microscopic changes to the surface of your cervix (Pap test).  For women ages 21-65, health care providers may recommend a pelvic exam and a Pap test every three years. For women ages 24-65, they may recommend the Pap test and pelvic exam, combined with testing for human papilloma virus (HPV), every five years. Some types of HPV increase your risk of cervical cancer. Testing for HPV may also be done on women of any age who have unclear Pap test results.  Other health care providers may not recommend any screening for nonpregnant women who are considered low risk for pelvic cancer and have no symptoms. Ask your health care provider if a screening pelvic exam is right for you.  If you have had past treatment for cervical cancer or a condition that could lead to cancer, you need Pap tests and screening for cancer for at least 20 years after your treatment. If Pap tests have been discontinued for you, your risk factors (such as having  a new sexual partner) need to be reassessed to determine if you should start having screenings again. Some women have medical problems that increase the chance of getting cervical cancer. In these cases, your health care provider may recommend that you have screening and Pap tests more often.  If you have a family history of uterine cancer or ovarian cancer, talk with your health care provider about genetic screening.  If you have vaginal bleeding after reaching menopause, tell your health care provider.  There are currently no reliable tests available to screen for ovarian cancer.  Lung Cancer Lung cancer screening is recommended for adults 26-55 years old who are at high risk for lung cancer because of a history of smoking. A yearly low-dose CT scan of the lungs is recommended if you:  Currently smoke.  Have a history of at least 30 pack-years of smoking and you currently smoke or have quit within the past 15 years. A pack-year is smoking an average of one pack of cigarettes per day for one year.  Yearly screening should:  Continue until it has been 15 years since you quit.  Stop if you develop a health problem that would prevent you from having lung cancer treatment.  Colorectal Cancer  This type of cancer can be detected and can often be prevented.  Routine colorectal cancer screening usually begins at age 50 and continues through age 75.  If you have risk factors for colon cancer, your health care provider may recommend that you be screened at an earlier age.  If you have a family history of colorectal cancer, talk with your health care provider about genetic screening.  Your health care provider may also recommend using home test kits to check for hidden blood in your stool.  A small camera at the end of a tube can be used to examine your colon directly (sigmoidoscopy or colonoscopy). This is done to check for the earliest forms of colorectal cancer.  Direct examination of the  colon should be repeated every 5-10 years until age 75. However, if early forms of precancerous polyps or small growths are found or if you have a family history or genetic risk for colorectal cancer, you may need to be screened more often.  Skin Cancer  Check your skin from head to toe regularly.  Monitor any moles. Be sure to tell your health care provider: ? About any new moles or changes in moles, especially if there is a change in a mole's shape or color. ? If you have a mole that is larger than the size of a pencil eraser.  If any of your family members has a history of skin cancer, especially at a young age, talk with your health care provider about genetic screening.  Always use sunscreen. Apply sunscreen liberally and repeatedly throughout the day.  Whenever you are outside, protect yourself by wearing long sleeves, pants, a wide-brimmed hat, and sunglasses.  What should I know about osteoporosis? Osteoporosis is a condition in which bone destruction happens more quickly than new bone creation. After menopause, you may be at an increased risk for osteoporosis. To help prevent osteoporosis or the bone fractures that can happen because of osteoporosis, the following is recommended:  If you are 19-50 years old, get at least 1,000 mg of calcium and at least 600 mg of vitamin D per day.  If you are older than age 50 but younger than age 70, get at least 1,200 mg of calcium and at least 600 mg of vitamin D per day.  If you are older than age 70, get at least 1,200 mg of calcium and at least 800 mg of vitamin D per day.  Smoking and excessive alcohol intake increase the risk of osteoporosis. Eat foods that are rich in calcium and vitamin D, and do weight-bearing exercises several times each week as directed by your health care provider. What should I know about how menopause affects my mental health? Depression may occur at any age, but it is more common as you become older. Common  symptoms of depression include:  Low or sad mood.  Changes in sleep patterns.  Changes in appetite or eating patterns.  Feeling an overall lack of motivation or enjoyment of activities that you previously enjoyed.  Frequent crying spells.  Talk with your health care provider if you think that you are experiencing depression. What should I know about immunizations? It is important that you get and maintain your immunizations. These include:  Tetanus, diphtheria, and pertussis (Tdap) booster vaccine.  Influenza every year before the flu season begins.  Pneumonia vaccine.  Shingles vaccine.  Your health care provider may also recommend other immunizations. This information is not intended to replace advice given to you by your health care provider. Make sure you discuss any questions you have with   your health care provider. Document Released: 10/28/2005 Document Revised: 03/25/2016 Document Reviewed: 06/09/2015 Elsevier Interactive Patient Education  2018 [employer].

## 2018-03-14 NOTE — Progress Notes (Signed)
Robin Bowers March 24, 1966 [id number]    History:    Presents for annual exam.  Has not been to our office in the past few years due to loss of insurance was followed with [facility] program - normal Paps and mammograms.  Same partner with negative STD screen.  BTL.  Postmenopausal with no bleeding for greater than 3 years.  Father died from colon cancer in his 20s has not had a screening colonoscopy. Smoker.  Past medical history, past surgical history, family history and social history were all reviewed and documented in the EPIC chart.  2 children ages 53 and 36 both doing well and working.  Mother stroke, hypertension and Alzheimer's deceased.  Has 2 horses.  History of Lyme disease.  ROS:  A ROS was performed and pertinent positives and negatives are included.  Exam:  Vitals:   03/14/18 0848  BP: 122/78  Weight: 141 lb (64 kg)  Height: 5' 3"$  (1.6 m)   Body mass index is 24.98 kg/m.   General appearance:  Normal Thyroid:  Symmetrical, normal in size, without palpable masses or nodularity. Respiratory  Auscultation:  Clear without wheezing or rhonchi Cardiovascular  Auscultation:  Regular rate, without rubs, murmurs or gallops  Edema/varicosities:  Not grossly evident Abdominal  Soft,nontender, without masses, guarding or rebound.  Liver/spleen:  No organomegaly noted  Hernia:  None appreciated  Skin  Inspection:  Grossly normal   Breasts: Examined lying and sitting.     Right: Without masses, retractions, discharge or axillary adenopathy.     Left: Without masses, retractions, discharge or axillary adenopathy. Gentitourinary   Inguinal/mons:  Normal without inguinal adenopathy  External genitalia:  Normal  BUS/Urethra/Skene's glands:  Normal  Vagina:  Normal  Cervix:  Normal  Uterus:  normal in size, shape and contour.  Midline and mobile  Adnexa/parametria:     Rt: Without masses or tenderness.   Lt: Without masses or tenderness.  Anus and  perineum: Normal  Digital rectal exam: Normal sphincter tone without palpated masses or tenderness  Assessment/Plan:  52 y.o. D WF G2, P2 for annual exam with no complaints.  Postmenopausal/no HRT/no bleeding History of Lyme disease Smoker  Plan: SBE's, continue annual screening mammogram, calcium rich foods, vitamin D 2000 daily encouraged.  Encouraged to continue regular exercise, weightbearing, balance type exercise such as yoga .  Reviewed importance of screening colonoscopy, [facility] information given instructed to schedule.  Aware of hazards of smoking, tips for quitting reviewed.  CBC, CMP, lipid panel, Pap normal with negative HR HPV 2018, new screening guidelines reviewed.Huel Cote [facility], 9:50 AM 03/14/2018

## 2018-03-16 LAB — URINE CULTURE
MICRO NUMBER:: 90768703
SPECIMEN QUALITY:: ADEQUATE
Sample Source: 0

## 2018-03-16 LAB — URINALYSIS, COMPLETE W/RFL CULTURE
Bacteria, UA: NONE SEEN /HPF
Bilirubin Urine: NEGATIVE
Glucose, UA: NEGATIVE
Hgb urine dipstick: NEGATIVE
Hyaline Cast: NONE SEEN /LPF
Ketones, ur: NEGATIVE
Nitrites, Initial: NEGATIVE
Protein, ur: NEGATIVE
Specific Gravity, Urine: 1.022 (ref 1.001–1.03)
pH: <=5 (ref 5.0–8.0)

## 2018-03-16 LAB — CULTURE INDICATED

## 2018-07-12 DIAGNOSIS — E782 Mixed hyperlipidemia: Secondary | ICD-10-CM | POA: Diagnosis not present

## 2018-07-12 DIAGNOSIS — R252 Cramp and spasm: Secondary | ICD-10-CM | POA: Diagnosis not present

## 2018-07-12 DIAGNOSIS — E785 Hyperlipidemia, unspecified: Secondary | ICD-10-CM | POA: Diagnosis not present

## 2018-07-12 DIAGNOSIS — R5383 Other fatigue: Secondary | ICD-10-CM | POA: Diagnosis not present

## 2018-07-12 DIAGNOSIS — M79604 Pain in right leg: Secondary | ICD-10-CM | POA: Diagnosis not present

## 2018-07-14 ENCOUNTER — Other Ambulatory Visit: Payer: Self-pay | Admitting: Family Medicine

## 2018-07-14 DIAGNOSIS — I739 Peripheral vascular disease, unspecified: Secondary | ICD-10-CM

## 2018-07-18 ENCOUNTER — Ambulatory Visit (HOSPITAL_COMMUNITY)
Admission: RE | Admit: 2018-07-18 | Discharge: 2018-07-18 | Disposition: A | Discharge status: Home / Self Care | Payer: 59 | Source: Ambulatory Visit | Attending: Family Medicine | Admitting: Family Medicine

## 2018-07-18 DIAGNOSIS — I70213 Atherosclerosis of native arteries of extremities with intermittent claudication, bilateral legs: Secondary | ICD-10-CM | POA: Diagnosis not present

## 2018-07-18 DIAGNOSIS — I739 Peripheral vascular disease, unspecified: Secondary | ICD-10-CM | POA: Insufficient documentation

## 2018-07-26 DIAGNOSIS — M79604 Pain in right leg: Secondary | ICD-10-CM | POA: Diagnosis not present

## 2018-07-26 DIAGNOSIS — E782 Mixed hyperlipidemia: Secondary | ICD-10-CM | POA: Diagnosis not present

## 2018-07-26 DIAGNOSIS — M79605 Pain in left leg: Secondary | ICD-10-CM | POA: Diagnosis not present

## 2018-11-20 DIAGNOSIS — E782 Mixed hyperlipidemia: Secondary | ICD-10-CM | POA: Diagnosis not present

## 2018-11-23 DIAGNOSIS — E782 Mixed hyperlipidemia: Secondary | ICD-10-CM | POA: Diagnosis not present

## 2018-11-23 DIAGNOSIS — M79604 Pain in right leg: Secondary | ICD-10-CM | POA: Diagnosis not present

## 2019-01-31 DIAGNOSIS — M25561 Pain in right knee: Secondary | ICD-10-CM | POA: Diagnosis not present

## 2019-01-31 DIAGNOSIS — M25461 Effusion, right knee: Secondary | ICD-10-CM | POA: Diagnosis not present

## 2019-05-17 ENCOUNTER — Other Ambulatory Visit: Payer: Self-pay | Admitting: Family Medicine

## 2019-05-17 DIAGNOSIS — Z1231 Encounter for screening mammogram for malignant neoplasm of breast: Secondary | ICD-10-CM

## 2019-05-24 ENCOUNTER — Ambulatory Visit
Admission: RE | Admit: 2019-05-24 | Discharge: 2019-05-24 | Disposition: A | Discharge status: Home / Self Care | Payer: 59 | Source: Ambulatory Visit | Attending: Family Medicine | Admitting: Family Medicine

## 2019-05-24 ENCOUNTER — Other Ambulatory Visit: Payer: Self-pay

## 2019-05-24 DIAGNOSIS — Z1231 Encounter for screening mammogram for malignant neoplasm of breast: Secondary | ICD-10-CM

## 2019-07-24 ENCOUNTER — Other Ambulatory Visit: Payer: Self-pay

## 2019-07-24 DIAGNOSIS — Z20822 Contact with and (suspected) exposure to covid-19: Secondary | ICD-10-CM

## 2019-07-25 LAB — NOVEL CORONAVIRUS, NAA: SARS-CoV-2, NAA: NOT DETECTED

## 2019-09-24 DIAGNOSIS — S2020XA Contusion of thorax, unspecified, initial encounter: Secondary | ICD-10-CM | POA: Diagnosis not present

## 2019-09-26 ENCOUNTER — Ambulatory Visit: Payer: BC Managed Care – PPO | Attending: Internal Medicine

## 2019-09-26 DIAGNOSIS — Z20822 Contact with and (suspected) exposure to covid-19: Secondary | ICD-10-CM | POA: Diagnosis not present

## 2019-09-28 LAB — NOVEL CORONAVIRUS, NAA: SARS-CoV-2, NAA: NOT DETECTED

## 2019-12-17 ENCOUNTER — Other Ambulatory Visit: Payer: Self-pay | Admitting: Internal Medicine

## 2019-12-17 DIAGNOSIS — D229 Melanocytic nevi, unspecified: Secondary | ICD-10-CM | POA: Diagnosis not present

## 2019-12-17 DIAGNOSIS — Z716 Tobacco abuse counseling: Secondary | ICD-10-CM | POA: Diagnosis not present

## 2019-12-17 DIAGNOSIS — Z13220 Encounter for screening for lipoid disorders: Secondary | ICD-10-CM | POA: Diagnosis not present

## 2019-12-17 DIAGNOSIS — Z0000 Encounter for general adult medical examination without abnormal findings: Secondary | ICD-10-CM | POA: Diagnosis not present

## 2019-12-17 DIAGNOSIS — R0989 Other specified symptoms and signs involving the circulatory and respiratory systems: Secondary | ICD-10-CM

## 2019-12-20 ENCOUNTER — Ambulatory Visit
Admission: RE | Admit: 2019-12-20 | Discharge: 2019-12-20 | Disposition: A | Discharge status: Home / Self Care | Payer: BC Managed Care – PPO | Source: Ambulatory Visit | Attending: Internal Medicine | Admitting: Internal Medicine

## 2019-12-20 DIAGNOSIS — I6523 Occlusion and stenosis of bilateral carotid arteries: Secondary | ICD-10-CM | POA: Diagnosis not present

## 2019-12-20 DIAGNOSIS — R0989 Other specified symptoms and signs involving the circulatory and respiratory systems: Secondary | ICD-10-CM

## 2020-01-02 DIAGNOSIS — D485 Neoplasm of uncertain behavior of skin: Secondary | ICD-10-CM | POA: Diagnosis not present

## 2020-01-02 DIAGNOSIS — C44319 Basal cell carcinoma of skin of other parts of face: Secondary | ICD-10-CM | POA: Diagnosis not present

## 2020-01-02 DIAGNOSIS — C44629 Squamous cell carcinoma of skin of left upper limb, including shoulder: Secondary | ICD-10-CM | POA: Diagnosis not present

## 2020-01-02 DIAGNOSIS — L570 Actinic keratosis: Secondary | ICD-10-CM | POA: Diagnosis not present

## 2020-01-02 DIAGNOSIS — L821 Other seborrheic keratosis: Secondary | ICD-10-CM | POA: Diagnosis not present

## 2020-01-02 DIAGNOSIS — Z85828 Personal history of other malignant neoplasm of skin: Secondary | ICD-10-CM | POA: Diagnosis not present

## 2020-01-03 ENCOUNTER — Encounter: Payer: Self-pay | Admitting: Vascular Surgery

## 2020-01-03 ENCOUNTER — Other Ambulatory Visit: Payer: Self-pay

## 2020-01-03 ENCOUNTER — Ambulatory Visit (INDEPENDENT_AMBULATORY_CARE_PROVIDER_SITE_OTHER): Payer: BC Managed Care – PPO | Admitting: Vascular Surgery

## 2020-01-03 VITALS — BP 146/78 | HR 54 | Temp 97.2°F | Resp 18 | Ht 63.0 in | Wt 131.9 lb

## 2020-01-03 DIAGNOSIS — I6523 Occlusion and stenosis of bilateral carotid arteries: Secondary | ICD-10-CM

## 2020-01-03 NOTE — Progress Notes (Signed)
Patient ID: Robin Bowers, female   DOB: 10-09-1965, 54 y.o.   MRN: [id number]  Reason for Consult: New Patient (Initial Visit)   Referred by Deland Pretty, MD  Subjective:     HPI:  Robin Bowers is a 54 y.o. female without very significant history did quit smoking after a long history on Sunday of this week after findings of carotid duplex.  She also started taking aspirin.  She is never had stroke TIA or amaurosis.  She has knee pain but no lower extremity issues.  She does not have any family history of aneurysm disease.  Past Medical History:  Diagnosis Date  . Lyme disease    Family History  Problem Relation Age of Onset  . Cancer Father        colon cancer- early 60's   . Alzheimer's disease Mother   . Stroke Mother 31  . Hypertension Mother   . Breast cancer Maternal Grandmother        after menopause  . Heart disease Maternal Grandmother   . Alzheimer's disease Maternal Grandmother   . Alzheimer's disease Maternal Grandfather    Past Surgical History:  Procedure Laterality Date  . CESAREAN SECTION     x2  . TUBAL LIGATION      Short Social History:  Social History   Tobacco Use  . Smoking status: Former Smoker    Packs/day: 0.50    Quit date: 12/29/2019    Years since quitting: 0.0  . Smokeless tobacco: Never Used  Substance Use Topics  . Alcohol use: Yes    Comment: rarely    No Known Allergies  Current Outpatient Medications  Medication Sig Dispense Refill  . aspirin EC 81 MG tablet Take 81 mg by mouth daily.    Marland Kitchen buPROPion (WELLBUTRIN XL) 150 MG 24 hr tablet Take 150 mg by mouth every morning.    . Ibuprofen (ADVIL PO) Take by mouth as needed.    . rosuvastatin (CRESTOR) 20 MG tablet Take 20 mg by mouth daily.     No current facility-administered medications for this visit.    Review of Systems  Constitutional:  Constitutional negative. HENT: HENT negative.  Eyes: Eyes negative.  Respiratory: Respiratory negative.    Cardiovascular: Cardiovascular negative.  GI: Gastrointestinal negative.  Musculoskeletal: Musculoskeletal negative.  Skin: Skin negative.  Neurological: Neurological negative. Hematologic: Hematologic/lymphatic negative.  Psychiatric: Psychiatric negative.        Objective:  Objective   Vitals:   01/03/20 1401  BP: (!) 154/72  Pulse: (!) 54  Resp: 18  Temp: (!) 97.2 F (36.2 C)  SpO2: 99%  Weight: 131 lb 14.4 oz (59.8 kg)  Height: 5\' 3"  (1.6 m)   Body mass index is 23.37 kg/m.  Physical Exam HENT:     Head: Normocephalic.     Nose:     Comments: She has a bandage in place    Mouth/Throat:     Mouth: Mucous membranes are moist.  Eyes:     Pupils: Pupils are equal, round, and reactive to light.  Neck:     Vascular: Carotid bruit present.  Cardiovascular:     Rate and Rhythm: Normal rate.     Pulses: Normal pulses.  Musculoskeletal:        General: No swelling. Normal range of motion.  Skin:    General: Skin is warm and dry.     Capillary Refill: Capillary refill takes less than 2 seconds.  Neurological:  General: No focal deficit present.     Mental Status: She is alert.  Psychiatric:        Mood and Affect: Mood normal.        Behavior: Behavior normal.        Thought Content: Thought content normal.        Judgment: Judgment normal.     Data: I reviewed her carotid artery duplexes which demonstrate bilateral carotid bifurcation plaque resulting in less than 50% stenoses.  There is a high-grade stenosis of the right external carotid artery.     Assessment/Plan:     53 year old female found to have right-sided carotid bruit this appears to likely be secondary to the external carotid artery on the right and I have demonstrated this via cartoon with the patient.  She does not appear to have any more than 50% stenosis of the ICA.  From that standpoint we will get her to follow-up in 1 year with repeat carotid duplexes.  We discussed the signs symptoms  of stroke.  I also counseled her on continuing smoking cessation she demonstrates good understanding.    Waynetta Sandy MD [facility]

## 2020-01-06 ENCOUNTER — Other Ambulatory Visit: Payer: Self-pay | Admitting: *Deleted

## 2020-01-06 DIAGNOSIS — C44629 Squamous cell carcinoma of skin of left upper limb, including shoulder: Secondary | ICD-10-CM | POA: Diagnosis not present

## 2020-01-06 DIAGNOSIS — I6523 Occlusion and stenosis of bilateral carotid arteries: Secondary | ICD-10-CM

## 2020-01-09 DIAGNOSIS — R0989 Other specified symptoms and signs involving the circulatory and respiratory systems: Secondary | ICD-10-CM | POA: Diagnosis not present

## 2020-01-09 DIAGNOSIS — I6523 Occlusion and stenosis of bilateral carotid arteries: Secondary | ICD-10-CM | POA: Diagnosis not present

## 2020-01-09 DIAGNOSIS — S6992XA Unspecified injury of left wrist, hand and finger(s), initial encounter: Secondary | ICD-10-CM | POA: Diagnosis not present

## 2020-01-10 DIAGNOSIS — S52615A Nondisplaced fracture of left ulna styloid process, initial encounter for closed fracture: Secondary | ICD-10-CM | POA: Diagnosis not present

## 2020-01-10 DIAGNOSIS — S52522A Torus fracture of lower end of left radius, initial encounter for closed fracture: Secondary | ICD-10-CM | POA: Diagnosis not present

## 2020-01-24 DIAGNOSIS — S52615A Nondisplaced fracture of left ulna styloid process, initial encounter for closed fracture: Secondary | ICD-10-CM | POA: Diagnosis not present

## 2020-01-24 DIAGNOSIS — S52522A Torus fracture of lower end of left radius, initial encounter for closed fracture: Secondary | ICD-10-CM | POA: Diagnosis not present

## 2020-01-24 DIAGNOSIS — Z23 Encounter for immunization: Secondary | ICD-10-CM | POA: Diagnosis not present

## 2020-01-30 DIAGNOSIS — C44319 Basal cell carcinoma of skin of other parts of face: Secondary | ICD-10-CM | POA: Diagnosis not present

## 2020-02-24 DIAGNOSIS — R0989 Other specified symptoms and signs involving the circulatory and respiratory systems: Secondary | ICD-10-CM | POA: Diagnosis not present

## 2020-03-11 DIAGNOSIS — Z03818 Encounter for observation for suspected exposure to other biological agents ruled out: Secondary | ICD-10-CM | POA: Diagnosis not present

## 2020-03-11 DIAGNOSIS — Z20822 Contact with and (suspected) exposure to covid-19: Secondary | ICD-10-CM | POA: Diagnosis not present

## 2020-05-13 DIAGNOSIS — Z1159 Encounter for screening for other viral diseases: Secondary | ICD-10-CM | POA: Diagnosis not present

## 2020-05-15 DIAGNOSIS — K5730 Diverticulosis of large intestine without perforation or abscess without bleeding: Secondary | ICD-10-CM | POA: Diagnosis not present

## 2020-05-15 DIAGNOSIS — K6289 Other specified diseases of anus and rectum: Secondary | ICD-10-CM | POA: Diagnosis not present

## 2020-05-15 DIAGNOSIS — K648 Other hemorrhoids: Secondary | ICD-10-CM | POA: Diagnosis not present

## 2020-05-15 DIAGNOSIS — Z1211 Encounter for screening for malignant neoplasm of colon: Secondary | ICD-10-CM | POA: Diagnosis not present

## 2020-05-15 DIAGNOSIS — K635 Polyp of colon: Secondary | ICD-10-CM | POA: Diagnosis not present

## 2020-06-22 ENCOUNTER — Ambulatory Visit: Payer: Self-pay

## 2020-06-22 ENCOUNTER — Ambulatory Visit (INDEPENDENT_AMBULATORY_CARE_PROVIDER_SITE_OTHER): Payer: BC Managed Care – PPO | Admitting: Orthopedic Surgery

## 2020-06-22 ENCOUNTER — Encounter: Payer: Self-pay | Admitting: Orthopedic Surgery

## 2020-06-22 VITALS — Ht 63.0 in | Wt 131.0 lb

## 2020-06-22 DIAGNOSIS — M170 Bilateral primary osteoarthritis of knee: Secondary | ICD-10-CM | POA: Diagnosis not present

## 2020-06-22 DIAGNOSIS — M25562 Pain in left knee: Secondary | ICD-10-CM

## 2020-06-22 DIAGNOSIS — M25561 Pain in right knee: Secondary | ICD-10-CM | POA: Diagnosis not present

## 2020-06-22 DIAGNOSIS — G8929 Other chronic pain: Secondary | ICD-10-CM

## 2020-06-23 ENCOUNTER — Encounter: Payer: Self-pay | Admitting: Orthopedic Surgery

## 2020-06-23 NOTE — Progress Notes (Signed)
Office Visit Note   Patient: Robin Bowers           Date of Birth: 11/07/1965           MRN: [id number] Visit Date: 06/22/2020              Requested by: Deland Pretty, MD [address] [place],  [place] [address] PCP: Deland Pretty, MD  Chief Complaint  Patient presents with  . Left Knee - Pain  . Right Knee - Pain      HPI: Patient is a 54 year old woman with osteoarthritis bilateral knees left worse than right.  Complains of pain globally difficulty going down steps and has decreased range of motion pain radiates from her knee down to her calf she has had a Doppler which is negative for DVT she complains of swelling mechanical catching locking and giving way with start up stiffness after prolonged rest.  Patient's been treated with good conservative care at [facility] she has had 3 steroid injections and aspirations with minimal relief.  Assessment & Plan: Visit Diagnoses:  1. Chronic pain of both knees   2. Primary osteoarthritis of both knees     Plan: Patient states she would like to proceed with a left total knee arthroplasty end of October beginning of November we will set this up at her convenience risks and benefits of surgery were discussed postoperative care was discussed patient states she understands and will call when she is ready to schedule surgery.  Follow-Up Instructions: No follow-ups on file.   Ortho Exam  Patient is alert, oriented, no adenopathy, well-dressed, normal affect, normal respiratory effort. Examination patient has an antalgic gait she lacks about 10 degrees to full extension of the left knee with weightbearing with seated there is also a fixed lack of full extension of the left knee there is a mild effusion of both knees there is no redness no cellulitis collaterals and cruciates are stable she is globally tender to palpation around the left knee greater than the right knee.  Imaging: XR Knee 1-2 Views  Left  Result Date: 06/23/2020 2 view radiographs of the left knee shows medial joint line narrowing with mild varus alignment periarticular spurs in all 3 compartments.  XR Knee 1-2 Views Right  Result Date: 06/23/2020 2 view radiographs of the right knee shows tricompartmental arthritic changes with medial joint line narrowing with varus alignment periarticular spurs in all 3 compartments  No images are attached to the encounter.  Labs: No results found for: HGBA1C, ESRSEDRATE, CRP, LABURIC, REPTSTATUS, GRAMSTAIN, CULT, LABORGA   No results found for: ALBUMIN, PREALBUMIN, LABURIC  No results found for: MG No results found for: VD25OH  No results found for: PREALBUMIN CBC EXTENDED Latest Ref Rng & Units 03/14/2018 10/02/2013 07/20/2012  WBC 3.8 - 10.8 Thousand/uL 14.1(H) 9.2 7.9  RBC 3.80 - 5.10 Million/uL 4.66 4.63 4.34  HGB 11.7 - 15.5 g/dL 14.3 14.6 13.7  HCT 35 - 45 % 41.3 42.7 39.4  PLT 140 - 400 Thousand/uL 219 214 231  NEUTROABS 1,500 - 7,800 cells/uL 10,462(H) 6.1 5.1  LYMPHSABS 850 - 3,900 cells/uL 2,919 2.5 2.1     Body mass index is 23.21 kg/m.  Orders:  Orders Placed This Encounter  Procedures  . XR Knee 1-2 Views Right  . XR Knee 1-2 Views Left   No orders of the defined types were placed in this encounter.    Procedures: No procedures performed  Clinical Data: No additional  findings.  ROS:  All other systems negative, except as noted in the HPI. Review of Systems  Objective: Vital Signs: Ht 5\' 3"  (1.6 m)   Wt 131 lb (59.4 kg)   BMI 23.21 kg/m   Specialty Comments:  No specialty comments available.  PMFS History: Patient Active Problem List   Diagnosis Date Noted  . Cigarette smoker one half pack a day or less 07/20/2012  . Lyme disease 07/20/2012   Past Medical History:  Diagnosis Date  . Lyme disease     Family History  Problem Relation Age of Onset  . Cancer Father        colon cancer- early 38's   . Alzheimer's disease Mother    . Stroke Mother 75  . Hypertension Mother   . Breast cancer Maternal Grandmother        after menopause  . Heart disease Maternal Grandmother   . Alzheimer's disease Maternal Grandmother   . Alzheimer's disease Maternal Grandfather     Past Surgical History:  Procedure Laterality Date  . CESAREAN SECTION     x2  . TUBAL LIGATION     Social History   Occupational History  . Not on file  Tobacco Use  . Smoking status: Former Smoker    Packs/day: 0.50    Quit date: 12/29/2019    Years since quitting: 0.4  . Smokeless tobacco: Never Used  Vaping Use  . Vaping Use: Never used  Substance and Sexual Activity  . Alcohol use: Yes    Comment: rarely  . Drug use: No  . Sexual activity: Yes    Birth control/protection: Surgical    Comment: intercourse age 89, more than 5 sexual partners, des neg

## 2020-06-24 ENCOUNTER — Telehealth: Payer: Self-pay

## 2020-06-24 NOTE — Telephone Encounter (Signed)
Patient called in wanting to speak with you to ask questions

## 2020-06-24 NOTE — Telephone Encounter (Signed)
Pt called and states that she would like to move forward with a total knee replacement on the left. She states that Dr. Sharol Given spoke with her about doing this a s a same day surgery so she would not have to stay in the hospital and that she would like to see if this can be done by the first week in November as her insurance is changing and would like to do this before the change. Advised I would forward message to you so that you can let her know what that looks like and if its possible.

## 2020-07-08 ENCOUNTER — Other Ambulatory Visit: Payer: Self-pay

## 2020-08-12 ENCOUNTER — Other Ambulatory Visit: Payer: Self-pay | Admitting: Internal Medicine

## 2020-08-12 DIAGNOSIS — Z0000 Encounter for general adult medical examination without abnormal findings: Secondary | ICD-10-CM

## 2020-08-21 ENCOUNTER — Other Ambulatory Visit: Payer: Self-pay

## 2020-08-21 ENCOUNTER — Ambulatory Visit: Payer: BC Managed Care – PPO | Admitting: Family

## 2020-08-21 ENCOUNTER — Encounter: Payer: Self-pay | Admitting: Family

## 2020-08-21 DIAGNOSIS — M1712 Unilateral primary osteoarthritis, left knee: Secondary | ICD-10-CM | POA: Diagnosis not present

## 2020-08-21 NOTE — Progress Notes (Signed)
Office Visit Note   Patient: Robin Bowers           Date of Birth: [birth year]           MRN: [id number] Visit Date: 08/21/2020              Requested by: Deland Pretty, MD [address] [place],  [place] [address] PCP: Deland Pretty, MD  No chief complaint on file.     HPI: The patient is a 54 year old woman who presents today complaining of worsening of her left knee pain, which is primarily in the popliteal fossa extending down her calf she feels fullness tightness with flexion.  Complains of associated swelling she is planning for total knee arthroplasty and wonders if this can be moved up to a sooner date  She is limping she does not think she can wait until after the holidays to have her surgery.  She is requesting we attempt to drain fluid from her knee  Assessment & Plan: Visit Diagnoses: No diagnosis found.  Plan: Unsuccessful aspiration today.  Will hold off on Depo-Medrol injection.  Malachy Mood to see if we can move her surgical date up closer.  Follow-Up Instructions: Return if symptoms worsen or fail to improve.   Left Knee Exam   Muscle Strength  The patient has normal left knee strength.  Tenderness  The patient is experiencing tenderness in the medial joint line and lateral joint line.  Range of Motion  The patient has normal left knee ROM.  Other  Erythema: absent Swelling: mild Effusion: effusion present      Patient is alert, oriented, no adenopathy, well-dressed, normal affect, normal respiratory effort.  Patient limping with ambulation today   Imaging: No results found. No images are attached to the encounter.  Labs: No results found for: HGBA1C, ESRSEDRATE, CRP, LABURIC, REPTSTATUS, GRAMSTAIN, CULT, LABORGA   No results found for: ALBUMIN, PREALBUMIN, LABURIC  No results found for: MG No results found for: VD25OH  No results found for: PREALBUMIN CBC EXTENDED Latest Ref Rng & Units 03/14/2018 10/02/2013 07/20/2012   WBC 3.8 - 10.8 Thousand/uL 14.1(H) 9.2 7.9  RBC 3.80 - 5.10 Million/uL 4.66 4.63 4.34  HGB 11.7 - 15.5 g/dL 14.3 14.6 13.7  HCT 35 - 45 % 41.3 42.7 39.4  PLT 140 - 400 Thousand/uL 219 214 231  NEUTROABS 1,500 - 7,800 cells/uL 10,462(H) 6.1 5.1  LYMPHSABS 850 - 3,900 cells/uL 2,919 2.5 2.1     There is no height or weight on file to calculate BMI.  Orders:  No orders of the defined types were placed in this encounter.  No orders of the defined types were placed in this encounter.    Procedures: Large Joint Inj on 08/26/2020 2:55 PM Indications: pain Details: 18 G 1.5 in needle, anteromedial approach Medications: 1 mL lidocaine 1 % Aspirate: 0 mL Outcome: tolerated well, no immediate complications Consent was given by the patient.      Clinical Data: No additional findings.  ROS:  All other systems negative, except as noted in the HPI. Review of Systems  Constitutional: Negative for chills and fever.  Musculoskeletal: Positive for arthralgias, gait problem and joint swelling.    Objective: Vital Signs: There were no vitals taken for this visit.  Specialty Comments:  No specialty comments available.  PMFS History: Patient Active Problem List   Diagnosis Date Noted  . Cigarette smoker one half pack a day or less 07/20/2012  . Lyme disease 07/20/2012  Past Medical History:  Diagnosis Date  . Lyme disease     Family History  Problem Relation Age of Onset  . Cancer Father        colon cancer- early 75's   . Alzheimer's disease Mother   . Stroke Mother 32  . Hypertension Mother   . Breast cancer Maternal Grandmother        after menopause  . Heart disease Maternal Grandmother   . Alzheimer's disease Maternal Grandmother   . Alzheimer's disease Maternal Grandfather     Past Surgical History:  Procedure Laterality Date  . CESAREAN SECTION     x2  . TUBAL LIGATION     Social History   Occupational History  . Not on file  Tobacco Use  . Smoking  status: Former Smoker    Packs/day: 0.50    Quit date: 12/29/2019    Years since quitting: 0.6  . Smokeless tobacco: Never Used  Vaping Use  . Vaping Use: Never used  Substance and Sexual Activity  . Alcohol use: Yes    Comment: rarely  . Drug use: No  . Sexual activity: Yes    Birth control/protection: Surgical    Comment: intercourse age 52, more than 5 sexual partners, des neg

## 2020-08-25 ENCOUNTER — Telehealth: Payer: Self-pay | Admitting: Orthopedic Surgery

## 2020-08-25 ENCOUNTER — Other Ambulatory Visit: Payer: Self-pay | Admitting: Physician Assistant

## 2020-08-25 NOTE — Telephone Encounter (Signed)
Patient submitted medical release form, short term disability and payment of $25.00 cash. Accepted 08/25/2020

## 2020-08-25 NOTE — Progress Notes (Signed)
[place], [place] [address] [place] [address] Phone: [phone] Fax: [phone]      Your procedure is scheduled on December 10  Report to [facility] Main Entrance "A" at 0530 A.M., and check in at the Admitting office.  Call this number if you have problems the morning of surgery:  [phone]  Call [phone] if you have any questions prior to your surgery date Monday-Friday 8am-4pm    Remember:  Do not eat after midnight the night before your surgery  You may drink clear liquids until 0430 am  the morning of your surgery.   Clear liquids allowed are: Water, Non-Citrus Juices (without pulp), Carbonated Beverages, Clear Tea, Black Coffee Only, and Gatorade   Enhanced Recovery after Surgery for Orthopedics Enhanced Recovery after Surgery is a protocol used to improve the stress on your body and your recovery after surgery.  Patient Instructions  . The night before surgery:  o No food after midnight. ONLY clear liquids after midnight  .  Marland Kitchen The day of surgery (if you do NOT have diabetes):  o Drink ONE (1) Pre-Surgery Clear Ensure by _0430 ____ am the morning of surgery   o This drink was given to you during your hospital  pre-op appointment visit. o Nothing else to drink after completing the  Pre-Surgery Clear Ensure.         If you have questions, please contact your surgeon's office.     Take these medicines the morning of surgery with A SIP OF WATER  rosuvastatin (CRESTOR)  As of today, STOP taking any Aspirin (unless otherwise instructed by your surgeon) Aleve, Naproxen, Ibuprofen, Motrin, Advil, Goody's, BC's, all herbal medications, fish oil, and all vitamins.                      Do not wear jewelry, make up, or nail polish            Do not wear lotions, powders, perfumes/colognes, or deodorant.            Do not shave 48 hours prior to surgery.              Do not bring valuables to the hospital.             [facility] is not responsible for any belongings or valuables.  Do NOT Smoke (Tobacco/Vaping) or drink Alcohol 24 hours prior to your procedure If you use a CPAP at night, you may bring all equipment for your overnight stay.   Contacts, glasses, dentures or bridgework may not be worn into surgery.      For patients admitted to the hospital, discharge time will be determined by your treatment team.   Patients discharged the day of surgery will not be allowed to drive home, and someone needs to stay with them for 24 hours.    Special instructions:   [facility]- Preparing For Surgery  Before surgery, you can play an important role. Because skin is not sterile, your skin needs to be as free of germs as possible. You can reduce the number of germs on your skin by washing with CHG (chlorahexidine gluconate) Soap before surgery.  CHG is an antiseptic cleaner which kills germs and bonds with the skin to continue killing germs even after washing.    Oral Hygiene is also important to reduce your risk of infection.  Remember - BRUSH YOUR TEETH THE MORNING OF  SURGERY WITH YOUR REGULAR TOOTHPASTE  Please do not use if you have an allergy to CHG or antibacterial soaps. If your skin becomes reddened/irritated stop using the CHG.  Do not shave (including legs and underarms) for at least 48 hours prior to first CHG shower. It is OK to shave your face.  Please follow these instructions carefully.   1. Shower the NIGHT BEFORE SURGERY and the MORNING OF SURGERY with CHG Soap.   2. If you chose to wash your hair, wash your hair first as usual with your normal shampoo.  3. After you shampoo, rinse your hair and body thoroughly to remove the shampoo.  4. Use CHG as you would any other liquid soap. You can apply CHG directly to the skin and wash gently with a scrungie or a clean washcloth.   5. Apply the CHG Soap to your body ONLY FROM THE NECK DOWN.  Do not use on open wounds or open sores. Avoid  contact with your eyes, ears, mouth and genitals (private parts). Wash Face and genitals (private parts)  with your normal soap.   6. Wash thoroughly, paying special attention to the area where your surgery will be performed.  7. Thoroughly rinse your body with warm water from the neck down.  8. DO NOT shower/wash with your normal soap after using and rinsing off the CHG Soap.  9. Pat yourself dry with a CLEAN TOWEL.  10. Wear CLEAN PAJAMAS to bed the night before surgery  11. Place CLEAN SHEETS on your bed the night of your first shower and DO NOT SLEEP WITH PETS.   Day of Surgery: Wear Clean/Comfortable clothing the morning of surgery Do not apply any deodorants/lotions.   Remember to brush your teeth WITH YOUR REGULAR TOOTHPASTE.   Please read over the following fact sheets that you were given.

## 2020-08-26 ENCOUNTER — Encounter (HOSPITAL_COMMUNITY): Payer: Self-pay

## 2020-08-26 ENCOUNTER — Other Ambulatory Visit: Payer: Self-pay

## 2020-08-26 ENCOUNTER — Other Ambulatory Visit (HOSPITAL_COMMUNITY)
Admission: RE | Admit: 2020-08-26 | Discharge: 2020-08-26 | Disposition: A | Discharge status: Home / Self Care | Payer: BC Managed Care – PPO | Source: Ambulatory Visit | Attending: Orthopedic Surgery | Admitting: Orthopedic Surgery

## 2020-08-26 ENCOUNTER — Encounter (HOSPITAL_COMMUNITY)
Admission: RE | Admit: 2020-08-26 | Discharge: 2020-08-26 | Disposition: A | Discharge status: Home / Self Care | Payer: BC Managed Care – PPO | Source: Ambulatory Visit | Attending: Orthopedic Surgery | Admitting: Orthopedic Surgery

## 2020-08-26 DIAGNOSIS — M1712 Unilateral primary osteoarthritis, left knee: Secondary | ICD-10-CM | POA: Diagnosis not present

## 2020-08-26 DIAGNOSIS — Z01812 Encounter for preprocedural laboratory examination: Secondary | ICD-10-CM | POA: Insufficient documentation

## 2020-08-26 DIAGNOSIS — Z20822 Contact with and (suspected) exposure to covid-19: Secondary | ICD-10-CM | POA: Insufficient documentation

## 2020-08-26 HISTORY — DX: Pure hypercholesterolemia, unspecified: E78.00

## 2020-08-26 LAB — CBC
HCT: 42.9 % (ref 36.0–46.0)
Hemoglobin: 13.8 g/dL (ref 12.0–15.0)
MCH: 29.7 pg (ref 26.0–34.0)
MCHC: 32.2 g/dL (ref 30.0–36.0)
MCV: 92.3 fL (ref 80.0–100.0)
Platelets: 251 10*3/uL (ref 150–400)
RBC: 4.65 MIL/uL (ref 3.87–5.11)
RDW: 12.7 % (ref 11.5–15.5)
WBC: 6.3 10*3/uL (ref 4.0–10.5)
nRBC: 0 % (ref 0.0–0.2)

## 2020-08-26 LAB — SURGICAL PCR SCREEN
MRSA, PCR: NEGATIVE
Staphylococcus aureus: NEGATIVE

## 2020-08-26 LAB — SARS CORONAVIRUS 2 (TAT 6-24 HRS): SARS Coronavirus 2: NEGATIVE

## 2020-08-26 MED ORDER — LIDOCAINE HCL 1 % IJ SOLN
1.0000 mL | INTRAMUSCULAR | Status: AC | PRN
  Administered 2020-08-26: 1 mL

## 2020-08-26 NOTE — Progress Notes (Signed)
PCP - Deland Pretty Cardiologist - denies  Chest x-ray - not needed EKG - not needed Stress Test - denies ECHO - denies Cardiac Cath - denies   ERAS Protcol - yes clear liquids until 0430 am PRE-SURGERY Ensure or G2- ensure given  COVID TEST- 08/26/20  Anesthesia review: no  Patient denies shortness of breath, fever, cough and chest pain at PAT appointment   All instructions explained to the patient, with a verbal understanding of the material. Patient agrees to go over the instructions while at home for a better understanding. Patient also instructed to self quarantine after being tested for COVID-19. The opportunity to ask questions was provided.

## 2020-08-27 NOTE — Anesthesia Preprocedure Evaluation (Addendum)
Anesthesia Evaluation  Patient identified by MRN, date of birth, ID band Patient awake    Reviewed: Allergy & Precautions, NPO status , Patient's Chart, lab work & pertinent test results  History of Anesthesia Complications Negative for: history of anesthetic complications  Airway Mallampati: II  TM Distance: >3 FB Neck ROM: Full    Dental   Pulmonary neg pulmonary ROS, former smoker,    Pulmonary exam normal        Cardiovascular Normal cardiovascular exam  HLD   Neuro/Psych negative neurological ROS  negative psych ROS   GI/Hepatic negative GI ROS, Neg liver ROS,   Endo/Other  negative endocrine ROS  Renal/GU negative Renal ROS  negative genitourinary   Musculoskeletal negative musculoskeletal ROS (+)   Abdominal   Peds  Hematology negative hematology ROS (+)   Anesthesia Other Findings   Reproductive/Obstetrics                            Anesthesia Physical Anesthesia Plan  ASA: II  Anesthesia Plan: Spinal   Post-op Pain Management:  Regional for Post-op pain   Induction:   PONV Risk Score and Plan: 2 and Propofol infusion, Treatment may vary due to age or medical condition, Ondansetron and TIVA  Airway Management Planned: Nasal Cannula and Simple Face Mask  Additional Equipment: None  Intra-op Plan:   Post-operative Plan:   Informed Consent: I have reviewed the patients History and Physical, chart, labs and discussed the procedure including the risks, benefits and alternatives for the proposed anesthesia with the patient or authorized representative who has indicated his/her understanding and acceptance.       Plan Discussed with:   Anesthesia Plan Comments:        Anesthesia Quick Evaluation

## 2020-08-28 ENCOUNTER — Ambulatory Visit (HOSPITAL_COMMUNITY): Payer: BC Managed Care – PPO | Admitting: Certified Registered Nurse Anesthetist

## 2020-08-28 ENCOUNTER — Encounter (HOSPITAL_COMMUNITY)
Admission: RE | Disposition: A | Discharge status: Home / Self Care | Payer: Self-pay | Source: Home / Self Care | Attending: Orthopedic Surgery

## 2020-08-28 ENCOUNTER — Observation Stay (HOSPITAL_COMMUNITY)
Admission: RE | Admit: 2020-08-28 | Discharge: 2020-08-29 | Disposition: A | Discharge status: Home / Self Care | Payer: BC Managed Care – PPO | Attending: Orthopedic Surgery | Admitting: Orthopedic Surgery

## 2020-08-28 ENCOUNTER — Other Ambulatory Visit: Payer: Self-pay

## 2020-08-28 ENCOUNTER — Encounter (HOSPITAL_COMMUNITY): Payer: Self-pay | Admitting: Orthopedic Surgery

## 2020-08-28 DIAGNOSIS — M1712 Unilateral primary osteoarthritis, left knee: Principal | ICD-10-CM | POA: Diagnosis present

## 2020-08-28 DIAGNOSIS — M25562 Pain in left knee: Secondary | ICD-10-CM | POA: Diagnosis not present

## 2020-08-28 DIAGNOSIS — Z87891 Personal history of nicotine dependence: Secondary | ICD-10-CM | POA: Insufficient documentation

## 2020-08-28 DIAGNOSIS — A6920 Lyme disease, unspecified: Secondary | ICD-10-CM | POA: Diagnosis not present

## 2020-08-28 DIAGNOSIS — E785 Hyperlipidemia, unspecified: Secondary | ICD-10-CM | POA: Diagnosis not present

## 2020-08-28 DIAGNOSIS — G8918 Other acute postprocedural pain: Secondary | ICD-10-CM | POA: Diagnosis not present

## 2020-08-28 HISTORY — PX: TOTAL KNEE ARTHROPLASTY: SHX125

## 2020-08-28 SURGERY — ARTHROPLASTY, KNEE, TOTAL
Anesthesia: Spinal | Site: Knee | Laterality: Left

## 2020-08-28 MED ORDER — ACETAMINOPHEN 325 MG PO TABS
325.0000 mg | ORAL_TABLET | Freq: Four times a day (QID) | ORAL | Status: DC | PRN
Start: 2020-08-29 — End: 2020-08-29
  Administered 2020-08-28 – 2020-08-29 (×2): 650 mg via ORAL
  Filled 2020-08-28 (×2): qty 2

## 2020-08-28 MED ORDER — CEFAZOLIN SODIUM-DEXTROSE 1-4 GM/50ML-% IV SOLN
1.0000 g | Freq: Four times a day (QID) | INTRAVENOUS | Status: AC
Start: 2020-08-28 — End: 2020-08-28
  Administered 2020-08-28 (×2): 1 g via INTRAVENOUS
  Filled 2020-08-28 (×2): qty 50

## 2020-08-28 MED ORDER — FENTANYL CITRATE (PF) 250 MCG/5ML IJ SOLN
INTRAMUSCULAR | Status: DC | PRN
  Administered 2020-08-28: 25 ug via INTRAVENOUS

## 2020-08-28 MED ORDER — CEFAZOLIN SODIUM-DEXTROSE 2-4 GM/100ML-% IV SOLN
INTRAVENOUS | Status: AC
  Filled 2020-08-28: qty 100

## 2020-08-28 MED ORDER — OXYCODONE HCL 5 MG PO TABS
5.0000 mg | ORAL_TABLET | ORAL | Status: DC | PRN
Start: 2020-08-28 — End: 2020-08-29

## 2020-08-28 MED ORDER — METOCLOPRAMIDE HCL 5 MG/ML IJ SOLN: 5.0000 mg | Freq: Three times a day (TID) | INTRAMUSCULAR | Status: DC | PRN

## 2020-08-28 MED ORDER — CHLORHEXIDINE GLUCONATE 0.12 % MT SOLN: 15.0000 mL | Freq: Once | OROMUCOSAL | Status: AC

## 2020-08-28 MED ORDER — PHENOL 1.4 % MT LIQD: 1.0000 | OROMUCOSAL | Status: DC | PRN

## 2020-08-28 MED ORDER — BUPIVACAINE IN DEXTROSE 0.75-8.25 % IT SOLN
INTRATHECAL | Status: DC | PRN
  Administered 2020-08-28: 1.6 mL via INTRATHECAL

## 2020-08-28 MED ORDER — MIDAZOLAM HCL 2 MG/2ML IJ SOLN
INTRAMUSCULAR | Status: AC
  Filled 2020-08-28: qty 2

## 2020-08-28 MED ORDER — ONDANSETRON HCL 4 MG/2ML IJ SOLN
INTRAMUSCULAR | Status: AC
  Filled 2020-08-28: qty 2

## 2020-08-28 MED ORDER — KETOROLAC TROMETHAMINE 15 MG/ML IJ SOLN
INTRAMUSCULAR | Status: AC
  Filled 2020-08-28: qty 1

## 2020-08-28 MED ORDER — ONDANSETRON HCL 4 MG/2ML IJ SOLN: 4.0000 mg | Freq: Once | INTRAMUSCULAR | Status: DC | PRN

## 2020-08-28 MED ORDER — ASPIRIN EC 325 MG PO TBEC
325.0000 mg | DELAYED_RELEASE_TABLET | Freq: Every day | ORAL | Status: DC
  Administered 2020-08-29: 325 mg via ORAL
  Filled 2020-08-28: qty 1

## 2020-08-28 MED ORDER — PHENYLEPHRINE 40 MCG/ML (10ML) SYRINGE FOR IV PUSH (FOR BLOOD PRESSURE SUPPORT)
PREFILLED_SYRINGE | INTRAVENOUS | Status: AC
  Filled 2020-08-28: qty 10

## 2020-08-28 MED ORDER — PROPOFOL 500 MG/50ML IV EMUL
INTRAVENOUS | Status: DC | PRN
  Administered 2020-08-28: 50 ug/kg/min via INTRAVENOUS

## 2020-08-28 MED ORDER — PHENYLEPHRINE HCL-NACL 10-0.9 MG/250ML-% IV SOLN
INTRAVENOUS | Status: DC | PRN
  Administered 2020-08-28: 20 ug/min via INTRAVENOUS

## 2020-08-28 MED ORDER — ONDANSETRON HCL 4 MG/2ML IJ SOLN
INTRAMUSCULAR | Status: AC
  Filled 2020-08-28: qty 6

## 2020-08-28 MED ORDER — EPHEDRINE 5 MG/ML INJ
INTRAVENOUS | Status: AC
  Filled 2020-08-28: qty 10

## 2020-08-28 MED ORDER — ROCURONIUM BROMIDE 10 MG/ML (PF) SYRINGE
PREFILLED_SYRINGE | INTRAVENOUS | Status: AC
  Filled 2020-08-28: qty 10

## 2020-08-28 MED ORDER — ONDANSETRON HCL 4 MG PO TABS: 4.0000 mg | ORAL_TABLET | Freq: Four times a day (QID) | ORAL | Status: DC | PRN

## 2020-08-28 MED ORDER — LACTATED RINGERS IV SOLN: INTRAVENOUS | Status: DC

## 2020-08-28 MED ORDER — DOCUSATE SODIUM 100 MG PO CAPS
100.0000 mg | ORAL_CAPSULE | Freq: Two times a day (BID) | ORAL | Status: DC
  Administered 2020-08-28 (×2): 100 mg via ORAL
  Filled 2020-08-28 (×3): qty 1

## 2020-08-28 MED ORDER — KETOROLAC TROMETHAMINE 15 MG/ML IJ SOLN
7.5000 mg | Freq: Four times a day (QID) | INTRAMUSCULAR | Status: AC
  Administered 2020-08-28 – 2020-08-29 (×4): 7.5 mg via INTRAVENOUS
  Filled 2020-08-28 (×3): qty 1

## 2020-08-28 MED ORDER — ROSUVASTATIN CALCIUM 20 MG PO TABS
20.0000 mg | ORAL_TABLET | Freq: Every day | ORAL | Status: DC
Start: 2020-08-28 — End: 2020-08-29
  Administered 2020-08-29: 20 mg via ORAL
  Filled 2020-08-28 (×2): qty 1

## 2020-08-28 MED ORDER — AMISULPRIDE (ANTIEMETIC) 5 MG/2ML IV SOLN: 10.0000 mg | Freq: Once | INTRAVENOUS | Status: DC | PRN

## 2020-08-28 MED ORDER — OXYCODONE HCL 5 MG/5ML PO SOLN: 5.0000 mg | Freq: Once | ORAL | Status: DC | PRN

## 2020-08-28 MED ORDER — MENTHOL 3 MG MT LOZG: 1.0000 | LOZENGE | OROMUCOSAL | Status: DC | PRN

## 2020-08-28 MED ORDER — SUCCINYLCHOLINE CHLORIDE 200 MG/10ML IV SOSY
PREFILLED_SYRINGE | INTRAVENOUS | Status: AC
  Filled 2020-08-28: qty 10

## 2020-08-28 MED ORDER — HYDROMORPHONE HCL 1 MG/ML IJ SOLN
0.5000 mg | INTRAMUSCULAR | Status: DC | PRN
Start: 2020-08-28 — End: 2020-08-29
  Administered 2020-08-28: 0.5 mg via INTRAVENOUS
  Filled 2020-08-28: qty 0.5

## 2020-08-28 MED ORDER — PROPOFOL 10 MG/ML IV BOLUS
INTRAVENOUS | Status: DC | PRN
  Administered 2020-08-28: 10 mg via INTRAVENOUS

## 2020-08-28 MED ORDER — 0.9 % SODIUM CHLORIDE (POUR BTL) OPTIME
TOPICAL | Status: DC | PRN
  Administered 2020-08-28: 1000 mL

## 2020-08-28 MED ORDER — ORAL CARE MOUTH RINSE: 15.0000 mL | Freq: Once | OROMUCOSAL | Status: AC

## 2020-08-28 MED ORDER — LIDOCAINE HCL (PF) 2 % IJ SOLN
INTRAMUSCULAR | Status: AC
  Filled 2020-08-28: qty 5

## 2020-08-28 MED ORDER — METOCLOPRAMIDE HCL 5 MG PO TABS: 5.0000 mg | ORAL_TABLET | Freq: Three times a day (TID) | ORAL | Status: DC | PRN

## 2020-08-28 MED ORDER — PROPOFOL 10 MG/ML IV BOLUS
INTRAVENOUS | Status: AC
  Filled 2020-08-28: qty 40

## 2020-08-28 MED ORDER — DEXAMETHASONE SODIUM PHOSPHATE 10 MG/ML IJ SOLN
INTRAMUSCULAR | Status: DC | PRN
  Administered 2020-08-28: 10 mg via INTRAVENOUS

## 2020-08-28 MED ORDER — ONDANSETRON HCL 4 MG/2ML IJ SOLN
INTRAMUSCULAR | Status: DC | PRN
  Administered 2020-08-28: 4 mg via INTRAVENOUS

## 2020-08-28 MED ORDER — CEFAZOLIN SODIUM-DEXTROSE 2-4 GM/100ML-% IV SOLN
2.0000 g | INTRAVENOUS | Status: AC
  Administered 2020-08-28: 2 g via INTRAVENOUS

## 2020-08-28 MED ORDER — CHLORHEXIDINE GLUCONATE 0.12 % MT SOLN
OROMUCOSAL | Status: AC
  Administered 2020-08-28: 15 mL via OROMUCOSAL
  Filled 2020-08-28: qty 15

## 2020-08-28 MED ORDER — DEXAMETHASONE SODIUM PHOSPHATE 10 MG/ML IJ SOLN
INTRAMUSCULAR | Status: AC
  Filled 2020-08-28: qty 1

## 2020-08-28 MED ORDER — TRANEXAMIC ACID 1000 MG/10ML IV SOLN
2000.0000 mg | Freq: Once | INTRAVENOUS | Status: DC
  Filled 2020-08-28: qty 20

## 2020-08-28 MED ORDER — FENTANYL CITRATE (PF) 100 MCG/2ML IJ SOLN
25.0000 ug | INTRAMUSCULAR | Status: DC | PRN
Start: 2020-08-28 — End: 2020-08-28
  Administered 2020-08-28: 50 ug via INTRAVENOUS

## 2020-08-28 MED ORDER — FENTANYL CITRATE (PF) 250 MCG/5ML IJ SOLN
INTRAMUSCULAR | Status: AC
  Filled 2020-08-28: qty 5

## 2020-08-28 MED ORDER — ONDANSETRON HCL 4 MG/2ML IJ SOLN: 4.0000 mg | Freq: Four times a day (QID) | INTRAMUSCULAR | Status: DC | PRN

## 2020-08-28 MED ORDER — SODIUM CHLORIDE 0.9 % IV SOLN: INTRAVENOUS | Status: DC

## 2020-08-28 MED ORDER — METOPROLOL TARTRATE 50 MG PO TABS
50.0000 mg | ORAL_TABLET | Freq: Two times a day (BID) | ORAL | Status: DC
  Administered 2020-08-28 – 2020-08-29 (×2): 50 mg via ORAL
  Filled 2020-08-28 (×2): qty 1

## 2020-08-28 MED ORDER — STERILE WATER FOR IRRIGATION IR SOLN
Status: DC | PRN
  Administered 2020-08-28: 1000 mL

## 2020-08-28 MED ORDER — OXYCODONE HCL 5 MG PO TABS: 5.0000 mg | ORAL_TABLET | Freq: Once | ORAL | Status: DC | PRN

## 2020-08-28 MED ORDER — TRANEXAMIC ACID 1000 MG/10ML IV SOLN
INTRAVENOUS | Status: DC | PRN
  Administered 2020-08-28: 2000 mg via TOPICAL

## 2020-08-28 MED ORDER — DEXAMETHASONE SODIUM PHOSPHATE 10 MG/ML IJ SOLN
INTRAMUSCULAR | Status: AC
  Filled 2020-08-28: qty 4

## 2020-08-28 MED ORDER — MIDAZOLAM HCL 2 MG/2ML IJ SOLN
INTRAMUSCULAR | Status: DC | PRN
  Administered 2020-08-28: 2 mg via INTRAVENOUS

## 2020-08-28 MED ORDER — ROPIVACAINE HCL 5 MG/ML IJ SOLN
INTRAMUSCULAR | Status: DC | PRN
  Administered 2020-08-28: 30 mL via PERINEURAL

## 2020-08-28 MED ORDER — SODIUM CHLORIDE 0.9 % IR SOLN
Status: DC | PRN
  Administered 2020-08-28: 3000 mL

## 2020-08-28 MED ORDER — FENTANYL CITRATE (PF) 100 MCG/2ML IJ SOLN
INTRAMUSCULAR | Status: AC
  Administered 2020-08-28: 50 ug
  Filled 2020-08-28: qty 2

## 2020-08-28 SURGICAL SUPPLY — 62 items
ATTUNE PSFEM LTSZ6 NARCEM KNEE (Femur) ×1 IMPLANT
BASEPLATE TIB CMT FB PCKT SZ5 (Knees) ×1 IMPLANT
BLADE SAGITTAL 25.0X1.19X90 (BLADE) ×2 IMPLANT
BLADE SAW SGTL 13X75X1.27 (BLADE) ×2 IMPLANT
BLADE SAW SGTL 18X1.27X75 (BLADE) ×2 IMPLANT
BLADE SURG 21 STRL SS (BLADE) ×4 IMPLANT
BNDG CMPR MED 10X6 ELC LF (GAUZE/BANDAGES/DRESSINGS) ×1
BNDG COHESIVE 6X5 TAN NS LF (GAUZE/BANDAGES/DRESSINGS) ×1 IMPLANT
BNDG COHESIVE 6X5 TAN STRL LF (GAUZE/BANDAGES/DRESSINGS) ×4 IMPLANT
BNDG ELASTIC 6X10 VLCR STRL LF (GAUZE/BANDAGES/DRESSINGS) ×1 IMPLANT
BNDG GAUZE ELAST 4 BULKY (GAUZE/BANDAGES/DRESSINGS) ×2 IMPLANT
BOWL SMART MIX CTS (DISPOSABLE) ×2 IMPLANT
BSPLAT TIB 5 CMNT FXBRNG STRL (Knees) ×1 IMPLANT
CEMENT BONE R 1X40 (Cement) ×4 IMPLANT
COOLER ICEMAN CLASSIC (MISCELLANEOUS) ×2 IMPLANT
COVER SURGICAL LIGHT HANDLE (MISCELLANEOUS) ×2 IMPLANT
COVER WAND RF STERILE (DRAPES) ×2 IMPLANT
CUFF TOURN SGL QUICK 34 (TOURNIQUET CUFF) ×2
CUFF TOURN SGL QUICK 42 (TOURNIQUET CUFF) IMPLANT
CUFF TRNQT CYL 34X4.125X (TOURNIQUET CUFF) ×1 IMPLANT
DRAPE EXTREMITY T 121X128X90 (DISPOSABLE) ×2 IMPLANT
DRAPE HALF SHEET 40X57 (DRAPES) ×4 IMPLANT
DRAPE U-SHAPE 47X51 STRL (DRAPES) ×2 IMPLANT
DRSG ADAPTIC 3X8 NADH LF (GAUZE/BANDAGES/DRESSINGS) ×2 IMPLANT
DRSG PAD ABDOMINAL 8X10 ST (GAUZE/BANDAGES/DRESSINGS) ×2 IMPLANT
DURAPREP 26ML APPLICATOR (WOUND CARE) ×2 IMPLANT
ELECT REM PT RETURN 9FT ADLT (ELECTROSURGICAL) ×2
ELECTRODE REM PT RTRN 9FT ADLT (ELECTROSURGICAL) ×1 IMPLANT
FACESHIELD WRAPAROUND (MASK) ×2 IMPLANT
FACESHIELD WRAPAROUND OR TEAM (MASK) ×1 IMPLANT
GAUZE SPONGE 4X4 12PLY STRL (GAUZE/BANDAGES/DRESSINGS) ×2 IMPLANT
GAUZE SPONGE 4X4 12PLY STRL LF (GAUZE/BANDAGES/DRESSINGS) ×1 IMPLANT
GLOVE BIOGEL PI IND STRL 9 (GLOVE) ×1 IMPLANT
GLOVE BIOGEL PI INDICATOR 9 (GLOVE) ×1
GLOVE SURG ORTHO 9.0 STRL STRW (GLOVE) ×2 IMPLANT
GOWN STRL REUS W/ TWL XL LVL3 (GOWN DISPOSABLE) ×2 IMPLANT
GOWN STRL REUS W/TWL XL LVL3 (GOWN DISPOSABLE) ×4
HANDPIECE INTERPULSE COAX TIP (DISPOSABLE) ×2
IMMOBILIZER KNEE 20 (SOFTGOODS) ×2
IMMOBILIZER KNEE 20 THIGH 36 (SOFTGOODS) IMPLANT
INSERT TIB PS FB ATTUNE SZ6X5 (Knees) ×1 IMPLANT
KIT BASIN OR (CUSTOM PROCEDURE TRAY) ×2 IMPLANT
KIT TURNOVER KIT B (KITS) ×2 IMPLANT
MANIFOLD NEPTUNE II (INSTRUMENTS) ×2 IMPLANT
NS IRRIG 1000ML POUR BTL (IV SOLUTION) ×2 IMPLANT
PACK TOTAL JOINT (CUSTOM PROCEDURE TRAY) ×2 IMPLANT
PAD ABD 8X10 STRL (GAUZE/BANDAGES/DRESSINGS) ×1 IMPLANT
PAD ARMBOARD 7.5X6 YLW CONV (MISCELLANEOUS) ×2 IMPLANT
PAD COLD SHLDR WRAP-ON (PAD) ×2 IMPLANT
PATELLA MEDIAL ATTUN 35MM KNEE (Knees) ×1 IMPLANT
PIN DRILL FIX HALF THREAD (BIT) ×1 IMPLANT
PIN STEINMAN FIXATION KNEE (PIN) ×1 IMPLANT
SET HNDPC FAN SPRY TIP SCT (DISPOSABLE) ×1 IMPLANT
STAPLER VISISTAT 35W (STAPLE) ×2 IMPLANT
SUCTION FRAZIER HANDLE 10FR (MISCELLANEOUS)
SUCTION TUBE FRAZIER 10FR DISP (MISCELLANEOUS) IMPLANT
SUT VIC AB 0 CT1 27 (SUTURE) ×2
SUT VIC AB 0 CT1 27XBRD ANBCTR (SUTURE) ×1 IMPLANT
SUT VIC AB 1 CTX 36 (SUTURE)
SUT VIC AB 1 CTX36XBRD ANBCTR (SUTURE) IMPLANT
TOWEL GREEN STERILE (TOWEL DISPOSABLE) ×2 IMPLANT
TOWEL GREEN STERILE FF (TOWEL DISPOSABLE) ×2 IMPLANT

## 2020-08-28 NOTE — Evaluation (Signed)
Physical Therapy Evaluation Patient Details Name: Robin Bowers MRN: [id number] DOB: 1966/06/11 Today's Date: 08/28/2020   History of Present Illness  Pt is 54 yo female s/p L TKA on 08/28/20.  Clinical Impression  Pt is s/p LTKA resulting in the deficits listed below (see PT Problem List). Pt did excellent with therapy on DOS.  She was able to transfer with supervision and ambulated in hall with min guard and RW.  Pt with good pain control and quad activation.  She is very motivated and normally independent.  Pt lives alone but has several family members/friends that will provide initial 24 hr assist.  Pt will benefit from skilled PT to increase their independence and safety with mobility to allow discharge to the venue listed below.      Follow Up Recommendations Follow surgeon's recommendation for DC plan and follow-up therapies;Supervision/Assistance - 24 hour    Equipment Recommendations  Rolling walker with 5" wheels;3in1 (PT)    Recommendations for Other Services       Precautions / Restrictions Precautions Precautions: Fall Restrictions LLE Weight Bearing: Weight bearing as tolerated      Mobility  Bed Mobility Overal bed mobility: Needs Assistance Bed Mobility: Supine to Sit;Sit to Supine     Supine to sit: Supervision;HOB elevated Sit to supine: Supervision;HOB elevated   General bed mobility comments: Educated on use of R LE to assist L LE if needed (did not need today)    Transfers Overall transfer level: Needs assistance Equipment used: Rolling walker (2 wheeled) Transfers: Sit to/from Stand Sit to Stand: Min guard         General transfer comment: cues for safe hand placement and L LE management  Ambulation/Gait Ambulation/Gait assistance: Min guard Gait Distance (Feet): 125 Feet Assistive device: Rolling walker (2 wheeled) Gait Pattern/deviations: Step-to pattern;Decreased stride length;Decreased weight shift to left;Antalgic Gait  velocity: decreased   General Gait Details: Pt tending to ambulate on L toe - cued for foot flat and able to correct, but still with decreased heel strike. Cues for sequencing with RW  Stairs            Wheelchair Mobility    Modified Rankin (Stroke Patients Only)       Balance Overall balance assessment: Needs assistance Sitting-balance support: No upper extremity supported Sitting balance-Leahy Scale: Good     Standing balance support: Bilateral upper extremity supported;No upper extremity supported Standing balance-Leahy Scale: Fair Standing balance comment: Ambulated with RW but static stand able to remove hands from RW                             Pertinent Vitals/Pain Pain Assessment: 0-10 Pain Score: 4  Pain Location: L knee Pain Descriptors / Indicators: Discomfort Pain Intervention(s): Limited activity within patient's tolerance;Monitored during session;Premedicated before session;Repositioned    Home Living Family/patient expects to be discharged to:: Private residence Living Arrangements: Alone Available Help at Discharge: Family;Friend(s);Available 24 hours/day (daughter, son, and friends to assist) Type of Home: House Home Access: Level entry     Home Layout: One level Home Equipment: Cane - single point      Prior Function Level of Independence: Independent         Comments: Independent with ADLs, IADLs, driving, and community ambulation. Pt works for [employer] running errands (lots of in/out cars, walking).     Hand Dominance        Extremity/Trunk Assessment   Upper Extremity Assessment  Upper Extremity Assessment: Overall WFL for tasks assessed    Lower Extremity Assessment Lower Extremity Assessment: LLE deficits/detail;RLE deficits/detail RLE Deficits / Details: ROM WFL; MMT 5/5 LLE Deficits / Details: ROM: ankle and hip WFL, knee lacking 8ext , 70 flexion; MMT: ankle 5/5, knee 3/5, hip 3/5    Cervical / Trunk  Assessment Cervical / Trunk Assessment: Normal  Communication   Communication: No difficulties  Cognition Arousal/Alertness: Awake/alert Behavior During Therapy: WFL for tasks assessed/performed Overall Cognitive Status: Within Functional Limits for tasks assessed                                        General Comments General comments (skin integrity, edema, etc.): VSS; Educated on PT role/POC, no pivots, resting with leg straight/no pillow under knee, and safe ice use.  Pt with cryocuff in place - noted area posterior thigh where cuff on skin without barrier and skin was red.  Educated on having something between skin and ice.  Placed towel over this area, coban dressing covering remaining area under cuff as barrier.    Exercises Total Joint Exercises Ankle Circles/Pumps: AROM;Both;10 reps;Supine Quad Sets: AROM;Both;10 reps;Supine Other Exercises Other Exercises: Encouraged quad sets and anklepumps today.  Also, discussed could perform other AROM as needed   Assessment/Plan    PT Assessment Patient needs continued PT services  PT Problem List Decreased strength;Decreased mobility;Decreased range of motion;Decreased activity tolerance;Decreased balance;Decreased knowledge of use of DME;Pain       PT Treatment Interventions DME instruction;Therapeutic activities;Modalities;Gait training;Therapeutic exercise;Patient/family education;Stair training;Balance training;Functional mobility training    PT Goals (Current goals can be found in the Care Plan section)  Acute Rehab PT Goals Patient Stated Goal: return home and to riding horses PT Goal Formulation: With patient Time For Goal Achievement: 09/11/20 Potential to Achieve Goals: Good Additional Goals Additional Goal #1: Pt will achieve L knee ROM 0 to 90 degrees for gait and stairs    Frequency 7X/week   Barriers to discharge        Co-evaluation               AM-PAC PT "6 Clicks" Mobility  Outcome  Measure Help needed turning from your back to your side while in a flat bed without using bedrails?: A Little Help needed moving from lying on your back to sitting on the side of a flat bed without using bedrails?: A Little Help needed moving to and from a bed to a chair (including a wheelchair)?: A Little Help needed standing up from a chair using your arms (e.g., wheelchair or bedside chair)?: A Little Help needed to walk in hospital room?: A Little Help needed climbing 3-5 steps with a railing? : A Little 6 Click Score: 18    End of Session Equipment Utilized During Treatment: Gait belt Activity Tolerance: Patient tolerated treatment well Patient left: in bed;with call bell/phone within reach;with bed alarm set (no recliner available) Nurse Communication: Mobility status PT Visit Diagnosis: Other abnormalities of gait and mobility (R26.89);Muscle weakness (generalized) (M62.81)    Time: 0981-1914 PT Time Calculation (min) (ACUTE ONLY): 30 min   Charges:   PT Evaluation $PT Eval Moderate Complexity: 1 Mod PT Treatments $Gait Training: 8-22 mins        Abran Richard, PT Acute Rehab Services Pager [phone] [facility] [place] 08/28/2020, 5:57 PM

## 2020-08-28 NOTE — Op Note (Signed)
DATE OF SURGERY:  08/28/2020  TIME: 9:06 AM  PATIENT NAME:  Robin Bowers    AGE: 54 y.o.    PRE-OPERATIVE DIAGNOSIS:  Osteoarthritis Left Knee  POST-OPERATIVE DIAGNOSIS:  Osteoarthritis Left Knee  PROCEDURE:  Procedure(s): LEFT TOTAL KNEE ARTHROPLASTY  SURGEON: Meridee Score  ASSISTANT: April green  OPERATIVE IMPLANTS: Depuy , Posterior Stabilized.  Femur size 6, Tibia size 5, Patella size 35 3-peg oval button, with a 5 mm polyethylene insert.  @ENCIMAGES @    PREOPERATIVE INDICATIONS:   Robin Bowers is a 54 y.o. year old female with end stage degenerative arthritis of the knee who failed conservative treatment and elected for Total Knee Arthroplasty.   The risks, benefits, and alternatives were discussed at length including but not limited to the risks of infection, bleeding, nerve injury, stiffness, blood clots, the need for revision surgery, cardiopulmonary complications, among others, and they were willing to proceed.  OPERATIVE DESCRIPTION:  The patient was brought to the operative room and placed in a supine position.  General anesthesia was administered.  IV antibiotics were given.  The lower extremity was prepped and draped in the usual sterile fashion.  Charlie Pitter was used to cover all exposed skin. Time out was performed.    Anterior quadriceps tendon splitting approach was performed.  The patella was everted and osteophytes were removed.  The anterior horn of the medial and lateral meniscus was removed.   The distal femur was opened with the drill and the intramedullary distal femoral cutting jig was utilized, set at 5 degrees valgus resecting 9 mm off the distal femur.  Care was taken to protect the collateral ligaments.  Then the extramedullary tibial cutting jig was utilized set for 3 degree posterior slope.  Care was taken during the cut to protect the medial and collateral ligaments.  The proximal tibia was removed along with the posterior horns of the  menisci.  The PCL was sacrificed.    The extensor gap was measured and was approximately 5 mm.    The distal femoral sizing jig was applied, taking care to avoid notching.  Then the 4-in-1 cutting jig was applied and the anterior and posterior femur was cut, along with the chamfer cuts.  All posterior osteophytes were removed.  The flexion gap was then measured and was symmetric with the extension gap.  The distal femoral preparation using the appropriate jig to prepare the box.  The patella was then measured, and cut with the saw.    The proximal tibia sized and prepared accordingly with the reamer and the punch, and then all components were trialed with the poly insert.  The knee was found to have stable balance and full motion.  The knee was irrigated with normal saline and the knee was soaked with TXA.  The above named components were then cemented into place and all excess cement was removed.  The final polyethylene component was in place during cementation.  The knee was kept in extension until the cement hardened.  The knee was then taken through a range of motion and the patella tracked well and the knee irrigated copiously and the parapatellar and subcutaneous tissue closed with vicryl, and skin closed with staples..  A sterile dressing was applied and patient  was taken to the PACU in stable  condition.  There were no complications.  Total tourniquet time was 30 minutes.

## 2020-08-28 NOTE — Anesthesia Procedure Notes (Signed)
Anesthesia Regional Block: Adductor canal block   Pre-Anesthetic Checklist: ,, timeout performed, Correct Patient, Correct Site, Correct Laterality, Correct Procedure, Correct Position, site marked, Risks and benefits discussed,  Surgical consent,  Pre-op evaluation,  At surgeon's request and post-op pain management  Laterality: Left  Prep: chloraprep       Needles:  Injection technique: Single-shot  Needle Type: Echogenic Stimulator Needle     Needle Length: 10cm  Needle Gauge: 20     Additional Needles:   Procedures:,,,, ultrasound used (permanent image in chart),,,,  Narrative:  Start time: 08/28/2020 7:10 AM End time: 08/28/2020 7:15 AM Injection made incrementally with aspirations every 5 mL.  Performed by: Personally  Anesthesiologist: Lidia Collum, MD  Additional Notes: Standard monitors applied. Skin prepped. Good needle visualization with ultrasound. Injection made in 5cc increments with no resistance to injection. Patient tolerated the procedure well.

## 2020-08-28 NOTE — Anesthesia Procedure Notes (Signed)
Spinal  Patient location during procedure: OR Staffing Performed: anesthesiologist  Anesthesiologist: Emera Bussie E, MD Preanesthetic Checklist Completed: patient identified, IV checked, risks and benefits discussed, surgical consent, monitors and equipment checked, pre-op evaluation and timeout performed Spinal Block Patient position: sitting Prep: DuraPrep and site prepped and draped Patient monitoring: continuous pulse ox, blood pressure and heart rate Approach: midline Location: L3-4 Injection technique: single-shot Needle Needle type: Pencan  Needle gauge: 24 G Needle length: 9 cm Additional Notes Functioning IV was confirmed and monitors were applied. Sterile prep and drape, including hand hygiene and sterile gloves were used. The patient was positioned and the spine was prepped. The skin was anesthetized with lidocaine.  Free flow of clear CSF was obtained prior to injecting local anesthetic into the CSF. The needle was carefully withdrawn. The patient tolerated the procedure well.      

## 2020-08-28 NOTE — Anesthesia Postprocedure Evaluation (Signed)
Anesthesia Post Note  Patient: Nashay Brickley Tolen  Procedure(s) Performed: LEFT TOTAL KNEE ARTHROPLASTY (Left Knee)     Patient location during evaluation: PACU Anesthesia Type: Spinal Level of consciousness: oriented and awake and alert Pain management: pain level controlled Vital Signs Assessment: post-procedure vital signs reviewed and stable Respiratory status: spontaneous breathing, respiratory function stable and nonlabored ventilation Cardiovascular status: blood pressure returned to baseline and stable Postop Assessment: no headache, no backache, no apparent nausea or vomiting and spinal receding Anesthetic complications: no   No complications documented.  Last Vitals:  Vitals:   08/28/20 1300 08/28/20 1329  BP: (!) 144/69 (!) 170/74  Pulse: (!) 57 (!) 55  Resp: 15 16  Temp: (!) 36.2 C 36.6 C  SpO2: 97% 98%    Last Pain:  Vitals:   08/28/20 1329  TempSrc: Oral  PainSc:                  Lidia Collum

## 2020-08-28 NOTE — Discharge Instructions (Signed)
INSTRUCTIONS AFTER JOINT REPLACEMENT   o Remove items at home which could result in a fall. This includes throw rugs or furniture in walking pathways o ICE to the affected joint every three hours while awake for 30 minutes at a time, for at least the first 3-5 days, and then as needed for pain and swelling.  Continue to use ice for pain and swelling. You may notice swelling that will progress down to the foot and ankle.  This is normal after surgery.  Elevate your leg when you are not up walking on it.   o Continue to use the breathing machine you got in the hospital (incentive spirometer) which will help keep your temperature down.  It is common for your temperature to cycle up and down following surgery, especially at night when you are not up moving around and exerting yourself.  The breathing machine keeps your lungs expanded and your temperature down.   DIET:  As you were doing prior to hospitalization, we recommend a well-balanced diet.  DRESSING / WOUND CARE / SHOWERING  Keep Dressing Dry and in Place  ACTIVITY  o Increase activity slowly as tolerated, but follow the weight bearing instructions below.   o No driving for 6 weeks or until further direction given by your physician.  You cannot drive while taking narcotics.  o No lifting or carrying greater than 10 lbs. until further directed by your surgeon. o Avoid periods of inactivity such as sitting longer than an hour when not asleep. This helps prevent blood clots.  o You may return to work once you are authorized by your doctor.     WEIGHT BEARING   Weight bearing as tolerated with assist device (walker, cane, etc) as directed, use it as long as suggested by your surgeon or therapist, typically at least 4-6 weeks.   EXERCISES  Results after joint replacement surgery are often greatly improved when you follow the exercise, range of motion and muscle strengthening exercises prescribed by your doctor. Safety measures are also  important to protect the joint from further injury. Any time any of these exercises cause you to have increased pain or swelling, decrease what you are doing until you are comfortable again and then slowly increase them. If you have problems or questions, call your caregiver or physical therapist for advice.   Rehabilitation is important following a joint replacement. After just a few days of immobilization, the muscles of the leg can become weakened and shrink (atrophy).  These exercises are designed to build up the tone and strength of the thigh and leg muscles and to improve motion. Often times heat used for twenty to thirty minutes before working out will loosen up your tissues and help with improving the range of motion but do not use heat for the first two weeks following surgery (sometimes heat can increase post-operative swelling).   These exercises can be done on a training (exercise) mat, on the floor, on a table or on a bed. Use whatever works the best and is most comfortable for you.    Use music or television while you are exercising so that the exercises are a pleasant break in your day. This will make your life better with the exercises acting as a break in your routine that you can look forward to.   Perform all exercises about fifteen times, three times per day or as directed.  You should exercise both the operative leg and the other leg as well.  Exercises  include:   . Quad Sets - Tighten up the muscle on the front of the thigh (Quad) and hold for 5-10 seconds.   . Straight Leg Raises - With your knee straight (if you were given a brace, keep it on), lift the leg to 60 degrees, hold for 3 seconds, and slowly lower the leg.  Perform this exercise against resistance later as your leg gets stronger.  . Leg Slides: Lying on your back, slowly slide your foot toward your buttocks, bending your knee up off the floor (only go as far as is comfortable). Then slowly slide your foot back down until  your leg is flat on the floor again.  Glenard Haring Wings: Lying on your back spread your legs to the side as far apart as you can without causing discomfort.  . Hamstring Strength:  Lying on your back, push your heel against the floor with your leg straight by tightening up the muscles of your buttocks.  Repeat, but this time bend your knee to a comfortable angle, and push your heel against the floor.  You may put a pillow under the heel to make it more comfortable if necessary.   A rehabilitation program following joint replacement surgery can speed recovery and prevent re-injury in the future due to weakened muscles. Contact your doctor or a physical therapist for more information on knee rehabilitation.    CONSTIPATION  Constipation is defined medically as fewer than three stools per week and severe constipation as less than one stool per week.  Even if you have a regular bowel pattern at home, your normal regimen is likely to be disrupted due to multiple reasons following surgery.  Combination of anesthesia, postoperative narcotics, change in appetite and fluid intake all can affect your bowels.   YOU MUST use at least one of the following options; they are listed in order of increasing strength to get the job done.  They are all available over the counter, and you may need to use some, POSSIBLY even all of these options:    Drink plenty of fluids (prune juice may be helpful) and high fiber foods Colace 100 mg by mouth twice a day  Senokot for constipation as directed and as needed Dulcolax (bisacodyl), take with full glass of water  Miralax (polyethylene glycol) once or twice a day as needed.  If you have tried all these things and are unable to have a bowel movement in the first 3-4 days after surgery call either your surgeon or your primary doctor.    If you experience loose stools or diarrhea, hold the medications until you stool forms back up.  If your symptoms do not get better within 1 week  or if they get worse, check with your doctor.  If you experience "the worst abdominal pain ever" or develop nausea or vomiting, please contact the office immediately for further recommendations for treatment.   ITCHING:  If you experience itching with your medications, try taking only a single pain pill, or even half a pain pill at a time.  You can also use Benadryl over the counter for itching or also to help with sleep.   TED HOSE STOCKINGS:  Use stockings on both legs until for at least 2 weeks or as directed by physician office. They may be removed at night for sleeping.  MEDICATIONS:  See your medication summary on the "After Visit Summary" that nursing will review with you.  You may have some home medications which will be  placed on hold until you complete the course of blood thinner medication.  It is important for you to complete the blood thinner medication as prescribed.  PRECAUTIONS:  If you experience chest pain or shortness of breath - call 911 immediately for transfer to the hospital emergency department.   If you develop a fever greater that 101 F, purulent drainage from wound, increased redness or drainage from wound, foul odor from the wound/dressing, or calf pain - CONTACT YOUR SURGEON.                                                   FOLLOW-UP APPOINTMENTS:  If you do not already have a post-op appointment, please call the office for an appointment to be seen by your surgeon.  Guidelines for how soon to be seen are listed in your "After Visit Summary", but are typically between 1-4 weeks after surgery.  OTHER INSTRUCTIONS:   Knee Replacement:  Do not place pillow under knee, focus on keeping the knee straight while resting. CPM instructions: 0-90 degrees, 2 hours in the morning, 2 hours in the afternoon, and 2 hours in the evening. Place foam block, curve side up under heel at all times except when in CPM or when walking.  DO NOT modify, tear, cut, or change the foam block in any  way.   DENTAL ANTIBIOTICS:  In most cases prophylactic antibiotics for Dental procdeures after total joint surgery are not necessary.  Exceptions are as follows:  1. History of prior total joint infection  2. Severely immunocompromised (Organ Transplant, cancer chemotherapy, Rheumatoid biologic meds such as [place])  3. Poorly controlled diabetes (A1C &gt; 8.0, blood glucose over 200)  If you have one of these conditions, contact your surgeon for an antibiotic prescription, prior to your dental procedure.   MAKE SURE YOU:  . Understand these instructions.  . Get help right away if you are not doing well or get worse.    Thank you for letting us be a part of your medical care team.  It is a privilege we respect greatly.  We hope these instructions will help you stay on track for a fast and full recovery!

## 2020-08-28 NOTE — Transfer of Care (Signed)
Immediate Anesthesia Transfer of Care Note  Patient: Robin Bowers  Procedure(s) Performed: LEFT TOTAL KNEE ARTHROPLASTY (Left Knee)  Patient Location: PACU  Anesthesia Type:Spinal and MAC combined with regional for post-op pain  Level of Consciousness: drowsy and patient cooperative  Airway & Oxygen Therapy: Patient Spontanous Breathing  Post-op Assessment: Report given to RN and Post -op Vital signs reviewed and stable  Post vital signs: Reviewed and stable  Last Vitals:  Vitals Value Taken Time  BP 122/63 08/28/20 0902  Temp    Pulse 50 08/28/20 0902  Resp 10 08/28/20 0902  SpO2 99 % 08/28/20 0902  Vitals shown include unvalidated device data.  Last Pain:  Vitals:   08/28/20 0555  TempSrc: Oral  PainSc: 3       Patients Stated Pain Goal: 3 (83/66/29 4765)  Complications: No complications documented.

## 2020-08-28 NOTE — H&P (Signed)
TOTAL KNEE ADMISSION H&P  Patient is being admitted for left total knee arthroplasty.  Subjective:  Chief Complaint:left knee pain.  HPI: Robin Bowers, 54 y.o. female, has a history of pain and functional disability in the left knee due to arthritis and has failed non-surgical conservative treatments for greater than 12 weeks to includecorticosteriod injections.  Onset of symptoms was gradual, starting 4 years ago with gradually worsening course since that time. The patient noted no past surgery on the left knee(s).  Patient currently rates pain in the left knee(s) at 8 out of 10 with activity. Patient has worsening of pain with activity and weight bearing.  Patient has evidence of subchondral cysts by imaging studies. This patient has had  There is no active infection.  Patient Active Problem List   Diagnosis Date Noted  . Cigarette smoker one half pack a day or less 07/20/2012  . Lyme disease 07/20/2012   Past Medical History:  Diagnosis Date  . High cholesterol   . Lyme disease     Past Surgical History:  Procedure Laterality Date  . CESAREAN SECTION     x2  . COLONOSCOPY    . TUBAL LIGATION    . WISDOM TOOTH EXTRACTION      Current Facility-Administered Medications  Medication Dose Route Frequency Provider Last Rate Last Admin  . ceFAZolin (ANCEF) 2-4 GM/100ML-% IVPB           . ceFAZolin (ANCEF) IVPB 2g/100 mL premix  2 g Intravenous On Call to [place], Bevely Palmer, PA      . lactated ringers infusion   Intravenous Continuous Annye Asa, MD       No Known Allergies  Social History   Tobacco Use  . Smoking status: Former Smoker    Packs/day: 0.50    Quit date: 12/29/2019    Years since quitting: 0.6  . Smokeless tobacco: Never Used  Substance Use Topics  . Alcohol use: Yes    Comment: rarely    Family History  Problem Relation Age of Onset  . Cancer Father        colon cancer- early 38's   . Alzheimer's disease Mother   . Stroke Mother 59  .  Hypertension Mother   . Breast cancer Maternal Grandmother        after menopause  . Heart disease Maternal Grandmother   . Alzheimer's disease Maternal Grandmother   . Alzheimer's disease Maternal Grandfather      Review of Systems  All other systems reviewed and are negative.   Objective:  Physical Exam  Vital signs in last 24 hours: Temp:  [97.7 F (36.5 C)] 97.7 F (36.5 C) (12/10 0555) Pulse Rate:  [54] 54 (12/10 0555) Resp:  [18] 18 (12/10 0555) BP: (156)/(69) 156/69 (12/10 0555) SpO2:  [98 %] 98 % (12/10 0555) Weight:  [68.7 kg] 68.7 kg (12/10 0555)  Labs:   Estimated body mass index is 26.84 kg/m as calculated from the following:   Height as of this encounter: 5\' 3"  (1.6 m).   Weight as of this encounter: 68.7 kg.   Imaging Review Plain radiographs demonstrate moderate degenerative joint disease of the left knee(s). The overall alignment ismild varus. The bone quality appears to be good for age and reported activity level.      Assessment/Plan:  End stage arthritis, left knee   The patient history, physical examination, clinical judgment of the provider and imaging studies are consistent with end stage degenerative joint disease of  the left knee(s) and total knee arthroplasty is deemed medically necessary. The treatment options including medical management, injection therapy arthroscopy and arthroplasty were discussed at length. The risks and benefits of total knee arthroplasty were presented and reviewed. The risks due to aseptic loosening, infection, stiffness, patella tracking problems, thromboembolic complications and other imponderables were discussed. The patient acknowledged the explanation, agreed to proceed with the plan and consent was signed. Patient is being admitted for inpatient treatment for surgery, pain control, PT, OT, prophylactic antibiotics, VTE prophylaxis, progressive ambulation and ADL's and discharge planning. The patient is planning to  be discharged home with home health services     Patient's anticipated LOS is less than 2 midnights, meeting these requirements: - Younger than 73 - Lives within 1 hour of care - Has a competent adult at home to recover with post-op recover - NO history of  - Chronic pain requiring opiods  - Diabetes  - Coronary Artery Disease  - Heart failure  - Heart attack  - Stroke  - DVT/VTE  - Cardiac arrhythmia  - Respiratory Failure/COPD  - Renal failure  - Anemia  - Advanced Liver disease

## 2020-08-28 NOTE — Plan of Care (Signed)

## 2020-08-28 NOTE — Interval H&P Note (Signed)
History and Physical Interval Note:  08/28/2020 7:36 AM  Robin Bowers  has presented today for surgery, with the diagnosis of Osteoarthritis Left Knee.  The various methods of treatment have been discussed with the patient and family. After consideration of risks, benefits and other options for treatment, the patient has consented to  Procedure(s): LEFT TOTAL KNEE ARTHROPLASTY (Left) as a surgical intervention.  The patient's history has been reviewed, patient examined, no change in status, stable for surgery.  I have reviewed the patient's chart and labs.  Questions were answered to the patient's satisfaction.     [name]

## 2020-08-28 NOTE — Anesthesia Procedure Notes (Signed)
Procedure Name: MAC Date/Time: 08/28/2020 7:49 AM Performed by: Darletta Moll, CRNA Pre-anesthesia Checklist: Patient identified, Emergency Drugs available, Suction available and Patient being monitored Patient Re-evaluated:Patient Re-evaluated prior to induction Oxygen Delivery Method: Simple face mask

## 2020-08-29 DIAGNOSIS — M1712 Unilateral primary osteoarthritis, left knee: Secondary | ICD-10-CM | POA: Diagnosis not present

## 2020-08-29 DIAGNOSIS — Z87891 Personal history of nicotine dependence: Secondary | ICD-10-CM | POA: Diagnosis not present

## 2020-08-29 DIAGNOSIS — M25562 Pain in left knee: Secondary | ICD-10-CM | POA: Diagnosis not present

## 2020-08-29 MED ORDER — OXYCODONE-ACETAMINOPHEN 5-325 MG PO TABS: 1.0000 | ORAL_TABLET | ORAL | 0 refills | Status: DC | PRN

## 2020-08-29 NOTE — Plan of Care (Signed)
  Problem: Education: Goal: Knowledge of General Education information will improve Description: Including pain rating scale, medication(s)/side effects and non-pharmacologic comfort measures Outcome: Progressing   Problem: Clinical Measurements: Goal: Will remain free from infection Outcome: Progressing   Problem: Pain Managment: Goal: General experience of comfort will improve Outcome: Progressing   

## 2020-08-29 NOTE — Progress Notes (Signed)
Physical Therapy Treatment Patient Details Name: Robin Bowers MRN: [id number] DOB: 16-Jun-1966 Today's Date: 08/29/2020    History of Present Illness Pt is 54 yo female s/p L TKA on 08/28/20.    PT Comments    Patient received at EOB with NT present assisting with dressing tasks. Able to maintain dynamic standing balance with no UEs and weight mostly shifted to R side, but encouraged her to use RW right now for safety regardless. Tolerated significant increase in gait distance- initially had significant gait deviations however able to correct to heel-toe step through pattern with equal stance times/step lengths however this is difficult to maintain when she is distracted or becomes fatigued. Worked through appropriate TKR HEP program with good return demonstration. Estimate L knee AROM to be about 20-25 degrees extension to perhaps 80 degrees flexion at best. Left in bed with all needs met this morning. Anticipate she will do well with f/u PT.     Follow Up Recommendations  Follow surgeon's recommendation for DC plan and follow-up therapies;Supervision/Assistance - 24 hour     Equipment Recommendations  Rolling walker with 5" wheels;3in1 (PT)    Recommendations for Other Services       Precautions / Restrictions Precautions Precautions: Fall Restrictions Weight Bearing Restrictions: No LLE Weight Bearing: Weight bearing as tolerated    Mobility  Bed Mobility Overal bed mobility: Needs Assistance Bed Mobility: Supine to Sit;Sit to Supine     Supine to sit: Supervision Sit to supine: Supervision   General bed mobility comments: S for safety, no physical assist given  Transfers Overall transfer level: Needs assistance Equipment used: Rolling walker (2 wheeled) Transfers: Sit to/from Stand Sit to Stand: Supervision         General transfer comment: S with VC for safety and hand placement, no physical assist given  Ambulation/Gait Ambulation/Gait assistance:  Supervision Gait Distance (Feet): 200 Feet Assistive device: Rolling walker (2 wheeled) Gait Pattern/deviations: Step-to pattern;Decreased stride length;Decreased dorsiflexion - left;Decreased weight shift to left;Trunk flexed Gait velocity: decreased   General Gait Details: tends to ambulate on L toe and with L knee flexed in stance- able to correct to heel toe step through pattern with even stance times with VC, but difficult to maintain when she is distracted or fatigued   Stairs             Wheelchair Mobility    Modified Rankin (Stroke Patients Only)       Balance Overall balance assessment: Needs assistance Sitting-balance support: No upper extremity supported;Feet supported Sitting balance-Leahy Scale: Normal     Standing balance support: No upper extremity supported;During functional activity Standing balance-Leahy Scale: Good Standing balance comment: able to maintain balance without UE support but weight shifted mostly to R LE                            Cognition Arousal/Alertness: Awake/alert Behavior During Therapy: WFL for tasks assessed/performed Overall Cognitive Status: Within Functional Limits for tasks assessed                                        Exercises Total Joint Exercises Ankle Circles/Pumps: Both;5 reps Quad Sets: Left;5 reps Towel Squeeze: Both;5 reps Short Arc Quad: Left;5 reps Heel Slides: Left;5 reps Straight Leg Raises: Left;5 reps Long Arc Quad: Left;5 reps Knee Flexion: Left;10 reps Goniometric ROM: estimate L  knee AROM to be about 20-25 degrees extension to 80 degrees flexion at best    General Comments        Pertinent Vitals/Pain Pain Assessment: Faces Faces Pain Scale: No hurt Pain Intervention(s): Limited activity within patient's tolerance;Monitored during session    Home Living                      Prior Function            PT Goals (current goals can now be found in the  care plan section) Acute Rehab PT Goals Patient Stated Goal: return home and to riding horses PT Goal Formulation: With patient Time For Goal Achievement: 09/11/20 Potential to Achieve Goals: Good Progress towards PT goals: Progressing toward goals    Frequency    7X/week      PT Plan Current plan remains appropriate    Co-evaluation              AM-PAC PT "6 Clicks" Mobility   Outcome Measure  Help needed turning from your back to your side while in a flat bed without using bedrails?: None Help needed moving from lying on your back to sitting on the side of a flat bed without using bedrails?: None Help needed moving to and from a bed to a chair (including a wheelchair)?: A Little Help needed standing up from a chair using your arms (e.g., wheelchair or bedside chair)?: A Little Help needed to walk in hospital room?: A Little Help needed climbing 3-5 steps with a railing? : A Little 6 Click Score: 20    End of Session   Activity Tolerance: Patient tolerated treatment well Patient left: in bed;with call bell/phone within reach Nurse Communication: Mobility status PT Visit Diagnosis: Other abnormalities of gait and mobility (R26.89);Muscle weakness (generalized) (M62.81)     Time: 2904-7533 PT Time Calculation (min) (ACUTE ONLY): 27 min  Charges:  $Gait Training: 8-22 mins $Therapeutic Exercise: 8-22 mins                     Windell Norfolk, DPT, PN1   Supplemental Physical Therapist [place]    Pager [phone] Acute Rehab Office [phone]

## 2020-08-29 NOTE — Discharge Summary (Signed)
Discharge Diagnoses:  Active Problems:   Unilateral primary osteoarthritis, left knee   Arthritis of left knee   Surgeries: Procedure(s): LEFT TOTAL KNEE ARTHROPLASTY on 08/28/2020    Consultants:   Discharged Condition: Improved  Hospital Course: Robin Bowers is an 54 y.o. female who was admitted 08/28/2020 with a chief complaint of osteoarthritis left knee, with a final diagnosis of Osteoarthritis Left Knee.  Patient was brought to the operating room on 08/28/2020 and underwent Procedure(s): LEFT TOTAL KNEE ARTHROPLASTY.    Patient was given perioperative antibiotics:  Anti-infectives (From admission, onward)   Start     Dose/Rate Route Frequency Ordered Stop   08/28/20 1415  ceFAZolin (ANCEF) IVPB 1 g/50 mL premix        1 g 100 mL/hr over 30 Minutes Intravenous Every 6 hours 08/28/20 1328 08/28/20 2147   08/28/20 0600  ceFAZolin (ANCEF) IVPB 2g/100 mL premix        2 g 200 mL/hr over 30 Minutes Intravenous On call to O.R. 08/28/20 0543 08/28/20 0815   08/28/20 0549  ceFAZolin (ANCEF) 2-4 GM/100ML-% IVPB       Note to Pharmacy: Jasmine Pang   : cabinet override      08/28/20 0549 08/28/20 0748    .  Patient was given sequential compression devices, early ambulation, and aspirin for DVT prophylaxis.  Recent vital signs:  Patient Vitals for the past 24 hrs:  BP Temp Temp src Pulse Resp SpO2  08/29/20 0755 (!) 152/66 98.2 F (36.8 C) Oral (!) 56 17 100 %  08/29/20 0312 (!) 146/65 98.4 F (36.9 C) Oral (!) 54 16 96 %  08/28/20 2100 (!) 160/65 -- -- 60 -- --  08/28/20 1932 (!) 182/69 97.6 F (36.4 C) Oral 68 17 97 %  08/28/20 1329 (!) 170/74 97.8 F (36.6 C) Oral (!) 55 16 98 %  08/28/20 1300 (!) 144/69 (!) 97.1 F (36.2 C) -- (!) 57 15 97 %  08/28/20 1202 (!) 168/78 -- -- 60 11 97 %  08/28/20 1141 (!) 199/73 -- -- (!) 55 11 99 %  08/28/20 1130 (!) 188/71 -- -- (!) 58 15 100 %  08/28/20 1100 (!) 164/71 -- -- (!) 50 11 97 %  08/28/20 1030 (!) 156/69 -- --  (!) 46 11 97 %  08/28/20 1000 (!) 163/70 (!) 97.1 F (36.2 C) -- (!) 48 17 100 %  08/28/20 0945 (!) 167/67 (!) 97.1 F (36.2 C) -- (!) 46 12 99 %  08/28/20 0930 133/66 -- -- (!) 48 13 98 %  08/28/20 0915 (!) 143/71 -- -- (!) 43 11 100 %  .  Recent laboratory studies: No results found.  Discharge Medications:   Allergies as of 08/29/2020   No Known Allergies     Medication List    TAKE these medications   ibuprofen 200 MG tablet Commonly known as: ADVIL Take 400-600 mg by mouth every 6 (six) hours as needed for headache or moderate pain.   oxyCODONE-acetaminophen 5-325 MG tablet Commonly known as: PERCOCET/ROXICET Take 1 tablet by mouth every 4 (four) hours as needed.   rosuvastatin 20 MG tablet Commonly known as: CRESTOR Take 20 mg by mouth daily.   VITAMIN C PO Take 1 tablet by mouth 2 (two) times a week.   VITAMIN D PO Take 1 capsule by mouth 2 (two) times a week.            Discharge Care Instructions  (From admission, onward)  Start     Ordered   08/29/20 0000  Weight bearing as tolerated       Question Answer Comment  Laterality left   Extremity Lower      08/29/20 0903          Diagnostic Studies: No results found.  Patient benefited maximally from their hospital stay and there were no complications.     Disposition: Discharge disposition: 01-Home or Self Care      Discharge Instructions    Call MD / Call 911   Complete by: As directed    If you experience chest pain or shortness of breath, CALL 911 and be transported to the hospital emergency room.  If you develope a fever above 101 F, pus (white drainage) or increased drainage or redness at the wound, or calf pain, call your surgeon's office.   Constipation Prevention   Complete by: As directed    Drink plenty of fluids.  Prune juice may be helpful.  You may use a stool softener, such as Colace (over the counter) 100 mg twice a day.  Use MiraLax (over the counter) for  constipation as needed.   Diet - low sodium heart healthy   Complete by: As directed    Ice pack   Complete by: As directed    Use the ice machine at home.   Increase activity slowly as tolerated   Complete by: As directed    Weight bearing as tolerated   Complete by: As directed    Laterality: left   Extremity: Lower      Follow-up Information    Persons, Bevely Palmer, PA Follow up in 1 week(s).   Specialty: Orthopedic Surgery Contact information: [address] [place] [address] [phone]                Signed: [name] 08/29/2020, 9:03 AM

## 2020-08-29 NOTE — Progress Notes (Signed)
Patient ID: Robin Bowers, female   DOB: 12/14/1965, 54 y.o.   MRN: [id number] Plan for discharge to home today prescription and discharge orders written.  Patient will need DME for discharge.

## 2020-08-29 NOTE — Progress Notes (Signed)
Discharge summary packet provided to pt with insructions. Pt verbalized understanding of instructions. Pt remains alert/oriented in no acute distress. D/C to home as ordered. No complaints.

## 2020-08-29 NOTE — TOC Initial Note (Signed)
Transition of Care [facility]) - Initial/Assessment Note    Patient Details  Name: Robin Bowers MRN: [id number] Date of Birth: [birth year]  Transition of Care [facility]) CM/SW Contact:    Marilu Favre, RN Phone Number: 08/29/2020, 9:35 AM  Clinical Narrative:                  Confirmed face sheet information with patient.   Arranged home health with [facility].  Ordered walker and 3 in 1 with Zach with [facility]  Expected Discharge Plan: [place] Barriers to Discharge: No Barriers Identified   Patient Goals and CMS Choice Patient states their goals for this hospitalization and ongoing recovery are:: to return to home CMS Medicare.gov Compare Post Acute Care list provided to:: Patient Choice offered to / list presented to : Patient  Expected Discharge Plan and Services Expected Discharge Plan: [place]   Discharge Planning Services: CM Consult Post Acute Care Choice: [place] arrangements for the past 2 months: Single Family Home Expected Discharge Date: 08/29/20               DME Arranged: 3-N-1,Walker rolling DME Agency: [employer] Date DME Agency Contacted: 08/29/20 Time DME Agency Contacted: [phone] Representative spoke with at DME Agency: [place]: PT [place]: [place] Date [place]: 08/29/20 Time [place]: [id number] Representative spoke with at [place]: [place] Arrangements/Services Living arrangements for the past 2 months: [place] Lives with:: Relatives Patient language and need for interpreter reviewed:: Yes Do you feel safe going back to the place where you live?: Yes      Need for Family Participation in Patient Care: Yes (Comment) Care giver support system in place?: Yes (comment)   Criminal Activity/Legal Involvement Pertinent to Current Situation/Hospitalization: No - Comment as needed  Activities of Daily  Living      Permission Sought/Granted   Permission granted to share information with : No              Emotional Assessment Appearance:: Appears stated age Attitude/Demeanor/Rapport: Engaged Affect (typically observed): Accepting Orientation: : Oriented to Self,Oriented to Place,Oriented to  Time,Oriented to Situation Alcohol / Substance Use: Not Applicable Psych Involvement: No (comment)  Admission diagnosis:  Arthritis of left knee [M17.12] Patient Active Problem List   Diagnosis Date Noted  . Arthritis of left knee 08/28/2020  . Unilateral primary osteoarthritis, left knee   . Cigarette smoker one half pack a day or less 07/20/2012  . Lyme disease 07/20/2012   PCP:  Deland Pretty, MD Pharmacy:   [facility] [address], [place] - [place] [address] [place] [place] [address] Phone: [phone] Fax: [phone]     Social Determinants of Health (SDOH) Interventions    Readmission Risk Interventions No flowsheet data found.

## 2020-08-30 DIAGNOSIS — Z87891 Personal history of nicotine dependence: Secondary | ICD-10-CM | POA: Diagnosis not present

## 2020-08-30 DIAGNOSIS — Z471 Aftercare following joint replacement surgery: Secondary | ICD-10-CM | POA: Diagnosis not present

## 2020-08-30 DIAGNOSIS — E7800 Pure hypercholesterolemia, unspecified: Secondary | ICD-10-CM | POA: Diagnosis not present

## 2020-08-30 DIAGNOSIS — Z96652 Presence of left artificial knee joint: Secondary | ICD-10-CM | POA: Diagnosis not present

## 2020-08-30 DIAGNOSIS — Z7982 Long term (current) use of aspirin: Secondary | ICD-10-CM | POA: Diagnosis not present

## 2020-08-31 ENCOUNTER — Telehealth: Payer: Self-pay | Admitting: Orthopedic Surgery

## 2020-08-31 ENCOUNTER — Encounter (HOSPITAL_COMMUNITY): Payer: Self-pay | Admitting: Orthopedic Surgery

## 2020-08-31 NOTE — Telephone Encounter (Signed)
I called and advised ok for orders as below. S/p a total knee replacement 08/28/20

## 2020-08-31 NOTE — Telephone Encounter (Signed)
Orland Mustard is requesting verbal orders for pt for  4 times this week  3 times next week

## 2020-09-01 DIAGNOSIS — Z87891 Personal history of nicotine dependence: Secondary | ICD-10-CM | POA: Diagnosis not present

## 2020-09-01 DIAGNOSIS — Z471 Aftercare following joint replacement surgery: Secondary | ICD-10-CM | POA: Diagnosis not present

## 2020-09-01 DIAGNOSIS — E7800 Pure hypercholesterolemia, unspecified: Secondary | ICD-10-CM | POA: Diagnosis not present

## 2020-09-01 DIAGNOSIS — Z7982 Long term (current) use of aspirin: Secondary | ICD-10-CM | POA: Diagnosis not present

## 2020-09-01 DIAGNOSIS — Z96652 Presence of left artificial knee joint: Secondary | ICD-10-CM | POA: Diagnosis not present

## 2020-09-02 DIAGNOSIS — Z96652 Presence of left artificial knee joint: Secondary | ICD-10-CM | POA: Diagnosis not present

## 2020-09-02 DIAGNOSIS — Z471 Aftercare following joint replacement surgery: Secondary | ICD-10-CM | POA: Diagnosis not present

## 2020-09-02 DIAGNOSIS — Z87891 Personal history of nicotine dependence: Secondary | ICD-10-CM | POA: Diagnosis not present

## 2020-09-02 DIAGNOSIS — Z7982 Long term (current) use of aspirin: Secondary | ICD-10-CM | POA: Diagnosis not present

## 2020-09-02 DIAGNOSIS — E7800 Pure hypercholesterolemia, unspecified: Secondary | ICD-10-CM | POA: Diagnosis not present

## 2020-09-03 ENCOUNTER — Telehealth: Payer: Self-pay | Admitting: Orthopedic Surgery

## 2020-09-03 ENCOUNTER — Ambulatory Visit (INDEPENDENT_AMBULATORY_CARE_PROVIDER_SITE_OTHER): Payer: BC Managed Care – PPO | Admitting: Orthopedic Surgery

## 2020-09-03 ENCOUNTER — Encounter: Payer: Self-pay | Admitting: Orthopedic Surgery

## 2020-09-03 VITALS — Ht 63.0 in | Wt 151.0 lb

## 2020-09-03 DIAGNOSIS — Z96652 Presence of left artificial knee joint: Secondary | ICD-10-CM

## 2020-09-03 NOTE — Progress Notes (Signed)
Office Visit Note   Patient: Robin Bowers           Date of Birth: 10/01/1965           MRN: [id number] Visit Date: 09/03/2020              Requested by: Deland Pretty, MD [address] [place],  [place] [address] PCP: Deland Pretty, MD  Chief Complaint  Patient presents with  . Left Knee - Routine Post Op    08/28/20 left total knee replacement       HPI: Patient is a 54 year old woman who presents 1 week status post left total knee arthroplasty.  Assessment & Plan: Visit Diagnoses:  1. Total knee replacement status, left     Plan: A thigh-high compression stocking was applied she will go to [facility] to get a double extra-large compression stocking to wear around-the-clock removed for showering she is given instructions for knee extension stretching.  Follow-Up Instructions: Return in about 1 week (around 09/10/2020).   Ortho Exam  Patient is alert, oriented, no adenopathy, well-dressed, normal affect, normal respiratory effort. Examination patient is ambulating well with a walker.  There is swelling in the left lower extremity with some ecchymosis and bruising near the ankle due to the dependence of her leg.  She lacks about 10 degrees to full extension.  The incision is well approximated no cellulitis.  Imaging: No results found. No images are attached to the encounter.  Labs: No results found for: HGBA1C, ESRSEDRATE, CRP, LABURIC, REPTSTATUS, GRAMSTAIN, CULT, LABORGA   No results found for: ALBUMIN, PREALBUMIN, LABURIC  No results found for: MG No results found for: VD25OH  No results found for: PREALBUMIN CBC EXTENDED Latest Ref Rng & Units 08/26/2020 03/14/2018 10/02/2013  WBC 4.0 - 10.5 K/uL 6.3 14.1(H) 9.2  RBC 3.87 - 5.11 MIL/uL 4.65 4.66 4.63  HGB 12.0 - 15.0 g/dL 13.8 14.3 14.6  HCT 36.0 - 46.0 % 42.9 41.3 42.7  PLT 150 - 400 K/uL 251 219 214  NEUTROABS 1,500 - 7,800 cells/uL - 10,462(H) 6.1  LYMPHSABS 850 -  3,900 cells/uL - 2,919 2.5     Body mass index is 26.75 kg/m.  Orders:  No orders of the defined types were placed in this encounter.  No orders of the defined types were placed in this encounter.    Procedures: No procedures performed  Clinical Data: No additional findings.  ROS:  All other systems negative, except as noted in the HPI. Review of Systems  Objective: Vital Signs: Ht 5\' 3"  (1.6 m)   Wt 151 lb (68.5 kg)   BMI 26.75 kg/m   Specialty Comments:  No specialty comments available.  PMFS History: Patient Active Problem List   Diagnosis Date Noted  . Arthritis of left knee 08/28/2020  . Unilateral primary osteoarthritis, left knee   . Cigarette smoker one half pack a day or less 07/20/2012  . Lyme disease 07/20/2012   Past Medical History:  Diagnosis Date  . High cholesterol   . Lyme disease     Family History  Problem Relation Age of Onset  . Cancer Father        colon cancer- early 57's   . Alzheimer's disease Mother   . Stroke Mother 38  . Hypertension Mother   . Breast cancer Maternal Grandmother        after menopause  . Heart disease Maternal Grandmother   . Alzheimer's disease Maternal Grandmother   .  Alzheimer's disease Maternal Grandfather     Past Surgical History:  Procedure Laterality Date  . CESAREAN SECTION     x2  . COLONOSCOPY    . TOTAL KNEE ARTHROPLASTY Left 08/28/2020   Procedure: LEFT TOTAL KNEE ARTHROPLASTY;  Surgeon: [name], MD;  Location: [facility];  Service: Orthopedics;  Laterality: Left;  . TUBAL LIGATION    . WISDOM TOOTH EXTRACTION     Social History   Occupational History  . Not on file  Tobacco Use  . Smoking status: Former Smoker    Packs/day: 0.50    Quit date: 12/29/2019    Years since quitting: 0.6  . Smokeless tobacco: Never Used  Vaping Use  . Vaping Use: Never used  Substance and Sexual Activity  . Alcohol use: Yes    Comment: rarely  . Drug use: No  . Sexual activity: Yes    Birth  control/protection: Surgical    Comment: intercourse age 67, more than 5 sexual partners, des neg

## 2020-09-03 NOTE — Telephone Encounter (Signed)
Received S/T disabilty paperwork   Forwarding to [place] today

## 2020-09-04 DIAGNOSIS — Z471 Aftercare following joint replacement surgery: Secondary | ICD-10-CM | POA: Diagnosis not present

## 2020-09-04 DIAGNOSIS — E7800 Pure hypercholesterolemia, unspecified: Secondary | ICD-10-CM | POA: Diagnosis not present

## 2020-09-04 DIAGNOSIS — Z87891 Personal history of nicotine dependence: Secondary | ICD-10-CM | POA: Diagnosis not present

## 2020-09-04 DIAGNOSIS — Z96652 Presence of left artificial knee joint: Secondary | ICD-10-CM | POA: Diagnosis not present

## 2020-09-04 DIAGNOSIS — Z7982 Long term (current) use of aspirin: Secondary | ICD-10-CM | POA: Diagnosis not present

## 2020-09-07 DIAGNOSIS — Z471 Aftercare following joint replacement surgery: Secondary | ICD-10-CM | POA: Diagnosis not present

## 2020-09-07 DIAGNOSIS — Z7982 Long term (current) use of aspirin: Secondary | ICD-10-CM | POA: Diagnosis not present

## 2020-09-07 DIAGNOSIS — Z87891 Personal history of nicotine dependence: Secondary | ICD-10-CM | POA: Diagnosis not present

## 2020-09-07 DIAGNOSIS — E7800 Pure hypercholesterolemia, unspecified: Secondary | ICD-10-CM | POA: Diagnosis not present

## 2020-09-07 DIAGNOSIS — Z96652 Presence of left artificial knee joint: Secondary | ICD-10-CM | POA: Diagnosis not present

## 2020-09-08 ENCOUNTER — Encounter: Payer: Self-pay | Admitting: Orthopedic Surgery

## 2020-09-08 ENCOUNTER — Ambulatory Visit (INDEPENDENT_AMBULATORY_CARE_PROVIDER_SITE_OTHER): Payer: BC Managed Care – PPO | Admitting: Physician Assistant

## 2020-09-08 ENCOUNTER — Telehealth: Payer: Self-pay | Admitting: Orthopedic Surgery

## 2020-09-08 VITALS — Ht 63.0 in | Wt 151.0 lb

## 2020-09-08 DIAGNOSIS — Z96652 Presence of left artificial knee joint: Secondary | ICD-10-CM

## 2020-09-08 MED ORDER — ONDANSETRON HCL 4 MG PO TABS: 4.0000 mg | ORAL_TABLET | Freq: Three times a day (TID) | ORAL | 0 refills | Status: DC | PRN

## 2020-09-08 MED ORDER — HYDROCODONE-ACETAMINOPHEN 5-325 MG PO TABS: 1.0000 | ORAL_TABLET | ORAL | 0 refills | Status: DC | PRN

## 2020-09-08 NOTE — Telephone Encounter (Signed)
Robin Bowers with [facility] called stating the pt will be unavailable for her appt today since she will be here at an appt with Dr. Sharol Given; and he just wanted to make [place] aware he will be marking it as a missed visit.   [phone]

## 2020-09-08 NOTE — Telephone Encounter (Signed)
noted 

## 2020-09-08 NOTE — Progress Notes (Signed)
Office Visit Note   Patient: Robin Bowers           Date of Birth: May 29, 1966           MRN: [id number] Visit Date: 09/08/2020              Requested by: Deland Pretty, MD [address] [place],  [place] [address] PCP: Deland Pretty, MD  Chief Complaint  Patient presents with  . Left Knee - Routine Post Op    08/28/20 left total knee replacement       HPI: Patient presents today 13 days status post left total knee replacement. She is ambulating with a walker. She is completed home therapy and would like to transition to outpatient therapy near her home. Her only complaint is some bruising upper thigh that somewhat painful. She has had difficulty tolerating the pain medication because of nausea  Assessment & Plan: Visit Diagnoses: No diagnosis found.  Plan we will try giving her Vicodin and I have also given her some Zofran in case she has nausea. Will transition to outpatient physical therapy surgical staples were harvested today  Follow-Up Instructions: No follow-ups on file.   Ortho Exam  Patient is alert, oriented, no adenopathy, well-dressed, normal affect, normal respiratory effort. Focused examination of her left knee demonstrates mild to moderate soft tissue swelling she does have some ecchymosis in her posterior thigh. No cellulitis no drainage wound is healed no evidence of infection no effusion  Imaging: No results found. No images are attached to the encounter.  Labs: No results found for: HGBA1C, ESRSEDRATE, CRP, LABURIC, REPTSTATUS, GRAMSTAIN, CULT, LABORGA   No results found for: ALBUMIN, PREALBUMIN, LABURIC  No results found for: MG No results found for: VD25OH  No results found for: PREALBUMIN CBC EXTENDED Latest Ref Rng & Units 08/26/2020 03/14/2018 10/02/2013  WBC 4.0 - 10.5 K/uL 6.3 14.1(H) 9.2  RBC 3.87 - 5.11 MIL/uL 4.65 4.66 4.63  HGB 12.0 - 15.0 g/dL 13.8 14.3 14.6  HCT 36.0 - 46.0 % 42.9 41.3 42.7  PLT 150 - 400 K/uL 251  219 214  NEUTROABS 1,500 - 7,800 cells/uL - 10,462(H) 6.1  LYMPHSABS 850 - 3,900 cells/uL - 2,919 2.5     Body mass index is 26.75 kg/m.  Orders:  No orders of the defined types were placed in this encounter.  Meds ordered this encounter  Medications  . ondansetron (ZOFRAN) 4 MG tablet    Sig: Take 1 tablet (4 mg total) by mouth every 8 (eight) hours as needed for nausea or vomiting.    Dispense:  20 tablet    Refill:  0  . HYDROcodone-acetaminophen (NORCO/VICODIN) 5-325 MG tablet    Sig: Take 1 tablet by mouth every 4 (four) hours as needed for moderate pain.    Dispense:  30 tablet    Refill:  0     Procedures: No procedures performed  Clinical Data: No additional findings.  ROS:  All other systems negative, except as noted in the HPI. Review of Systems  Objective: Vital Signs: Ht 5\' 3"  (1.6 m)   Wt 151 lb (68.5 kg)   BMI 26.75 kg/m   Specialty Comments:  No specialty comments available.  PMFS History: Patient Active Problem List   Diagnosis Date Noted  . Arthritis of left knee 08/28/2020  . Unilateral primary osteoarthritis, left knee   . Cigarette smoker one half pack a day or less 07/20/2012  . Lyme disease 07/20/2012   Past Medical  History:  Diagnosis Date  . High cholesterol   . Lyme disease     Family History  Problem Relation Age of Onset  . Cancer Father        colon cancer- early 61's   . Alzheimer's disease Mother   . Stroke Mother 56  . Hypertension Mother   . Breast cancer Maternal Grandmother        after menopause  . Heart disease Maternal Grandmother   . Alzheimer's disease Maternal Grandmother   . Alzheimer's disease Maternal Grandfather     Past Surgical History:  Procedure Laterality Date  . CESAREAN SECTION     x2  . COLONOSCOPY    . TOTAL KNEE ARTHROPLASTY Left 08/28/2020   Procedure: LEFT TOTAL KNEE ARTHROPLASTY;  Surgeon: [name], MD;  Location: [facility];  Service: Orthopedics;  Laterality: Left;  . TUBAL  LIGATION    . WISDOM TOOTH EXTRACTION     Social History   Occupational History  . Not on file  Tobacco Use  . Smoking status: Former Smoker    Packs/day: 0.50    Quit date: 12/29/2019    Years since quitting: 0.6  . Smokeless tobacco: Never Used  Vaping Use  . Vaping Use: Never used  Substance and Sexual Activity  . Alcohol use: Yes    Comment: rarely  . Drug use: No  . Sexual activity: Yes    Birth control/protection: Surgical    Comment: intercourse age 62, more than 5 sexual partners, des neg

## 2020-09-09 ENCOUNTER — Ambulatory Visit: Payer: BC Managed Care – PPO | Admitting: Physician Assistant

## 2020-09-09 DIAGNOSIS — Z96652 Presence of left artificial knee joint: Secondary | ICD-10-CM | POA: Diagnosis not present

## 2020-09-09 DIAGNOSIS — Z471 Aftercare following joint replacement surgery: Secondary | ICD-10-CM | POA: Diagnosis not present

## 2020-09-09 DIAGNOSIS — Z87891 Personal history of nicotine dependence: Secondary | ICD-10-CM | POA: Diagnosis not present

## 2020-09-09 DIAGNOSIS — Z7982 Long term (current) use of aspirin: Secondary | ICD-10-CM | POA: Diagnosis not present

## 2020-09-09 DIAGNOSIS — E7800 Pure hypercholesterolemia, unspecified: Secondary | ICD-10-CM | POA: Diagnosis not present

## 2020-09-15 ENCOUNTER — Encounter: Payer: Self-pay | Admitting: Physical Therapy

## 2020-09-15 ENCOUNTER — Other Ambulatory Visit: Payer: Self-pay

## 2020-09-15 ENCOUNTER — Ambulatory Visit: Payer: BC Managed Care – PPO | Attending: Orthopedic Surgery | Admitting: Physical Therapy

## 2020-09-15 DIAGNOSIS — M25662 Stiffness of left knee, not elsewhere classified: Secondary | ICD-10-CM | POA: Insufficient documentation

## 2020-09-15 DIAGNOSIS — R600 Localized edema: Secondary | ICD-10-CM | POA: Insufficient documentation

## 2020-09-15 DIAGNOSIS — G8929 Other chronic pain: Secondary | ICD-10-CM | POA: Insufficient documentation

## 2020-09-15 DIAGNOSIS — M25562 Pain in left knee: Secondary | ICD-10-CM | POA: Diagnosis not present

## 2020-09-15 DIAGNOSIS — Z96652 Presence of left artificial knee joint: Secondary | ICD-10-CM

## 2020-09-15 DIAGNOSIS — M6281 Muscle weakness (generalized): Secondary | ICD-10-CM

## 2020-09-15 NOTE — Therapy (Signed)
[facility] [address] [place], [place], [address] Phone: [phone]   Fax:  [phone]  Physical Therapy Evaluation  Patient Details  Name: Robin Bowers MRN: [id number] Date of Birth: 08-28-1966 Referring Provider (PT): Aldean Baker MD   Encounter Date: 09/15/2020   PT End of Session - 09/15/20 1156    Visit Number 1    Number of Visits 12    Date for PT Re-Evaluation 10/13/20    Authorization Type FOTO.    PT Start Time 1030    PT Stop Time 1120    PT Time Calculation (min) 50 min    Activity Tolerance Patient tolerated treatment well    Behavior During Therapy WFL for tasks assessed/performed           Past Medical History:  Diagnosis Date   High cholesterol    Lyme disease     Past Surgical History:  Procedure Laterality Date   CESAREAN SECTION     x2   COLONOSCOPY     TOTAL KNEE ARTHROPLASTY Left 08/28/2020   Procedure: LEFT TOTAL KNEE ARTHROPLASTY;  Surgeon: Nadara Mustard, MD;  Location: [facility];  Service: Orthopedics;  Laterality: Left;   TUBAL LIGATION     WISDOM TOOTH EXTRACTION      There were no vitals filed for this visit.    Subjective Assessment - 09/15/20 1103    Subjective COVID-19 screen performed prior to patient entering clinic.  The patient presents to the clinic today having undergoing a left total knee replacement performed on 08/28/20.  She has been compliant to her HEP.  She feels a bit discouraged as she is unable to perform a SLR at this time.  She is using a FWW for safety.  Her resting pain-level is a 4/10 today and of course higher with movement of her knee.  Ice decreases her pain.    Pertinent History Lymes disease.    Limitations Walking    How long can you walk comfortably? Around home with walker.    Patient Stated Goals Get back to normal.    Currently in Pain? Yes    Pain Score 4     Pain Location Knee    Pain Orientation Left    Pain Descriptors / Indicators  Aching;Sharp    Pain Type Surgical pain    Pain Onset 1 to 4 weeks ago    Pain Frequency Constant    Aggravating Factors  See above.    Pain Relieving Factors See above.              [facility] PT Assessment - 09/15/20 0001      Assessment   Medical Diagnosis left total knee replacement.    Referring Provider (PT) Aldean Baker MD    Onset Date/Surgical Date 08/28/20      Precautions   Precaution Comments No ultrasound.      Restrictions   Weight Bearing Restrictions No      Balance Screen   Has the patient fallen in the past 6 months No    Has the patient had a decrease in activity level because of a fear of falling?  Yes    Is the patient reluctant to leave their home because of a fear of falling?  Yes      Home Environment   Living Environment Private residence      Prior Function   Level of Independence Independent      Observation/Other Assessments   Observations Left  knee incision looks to be healing well.    Focus on Therapeutic Outcomes (FOTO)  78% limitation.      Observation/Other Assessments-Edema    Edema Circumferential      Circumferential Edema   Circumferential - Right LT 8.5 cms > RT.      ROM / Strength   AROM / PROM / Strength AROM;Strength      AROM   Overall AROM Comments -12 degrees and passive to -7 with active left knee flexion to 80 degrees.      Strength   Overall Strength Comments Right hip flexion= 2+ to 3-/5, hip abduction= 4/5, right knee extension is 2+ to 3-/5.      Palpation   Palpation comment C/o diffuse left knee pain.      Transfers   Comments Transitory movements require patient using her UE's to assist her left LE.      Ambulation/Gait   Gait Comments Safe ambulation with a FWW.                      Objective measurements completed on examination: See above findings.       [facility] Adult PT Treatment/Exercise - 09/15/20 0001      Exercises   Exercises Knee/Hip      Knee/Hip Exercises: Aerobic    Nustep Level 1 x 10 minutes moving forward x 1 to increase knee flexion.      Modalities   Modalities Estate agent Stimulation Location Left knee.    Electrical Stimulation Action IFC    Electrical Stimulation Parameters 1-10 Hz x 15 minutes.    Electrical Stimulation Goals Pain;Edema      Vasopneumatic   Number Minutes Vasopneumatic  15 minutes    Vasopnuematic Location  --   Left knee.   Vasopneumatic Pressure Low                       PT Long Term Goals - 09/15/20 1243      PT LONG TERM GOAL #1   Title Independent with a HEP.    Time 4    Period Weeks    Status New      PT LONG TERM GOAL #2   Title Full active left knee extension in order to normalize gait.    Time 4    Period Weeks    Status New      PT LONG TERM GOAL #3   Title Active left knee flexion to 115 degrees+ so the patient can perform functional tasks and do so with pain not > 2-3/10.    Time 4    Period Weeks    Status New      PT LONG TERM GOAL #4   Title Increase left hip and  knee strength to a solid 4+/5 to provide good stability for accomplishment of functional activities.    Time 4    Period Weeks    Status New      PT LONG TERM GOAL #5   Title Perform a reciprocating stair gait with one railing with pain not > 2-3/10.    Time 4    Period Weeks    Status New      PT LONG TERM GOAL #6   Title Decrease edema to within 2.5 cms of non-affected side to assist with pain reduction and range of motion gains.    Time 4  Period Weeks    Status New                  Plan - 09/15/20 1214    Clinical Impression Statement The patient presents to OPPT s/p left total knee replacement.  She presented to the clinic today ambulating with a FWW.  She lacks both flexion and extension currently and has a significant amount of edema.  She she unable to perform an antigravity SLR or SAQ at this time.  She states she is  compliant to her HEP.  Added two additional extension stretches today.  Patient will benefit from skilled physical therapy intervention to address deficits and pain.    Personal Factors and Comorbidities Comorbidity 1;Other    Comorbidities Lymes disease.    Examination-Activity Limitations Other;Transfers;Locomotion Level    Examination-Participation Restrictions Other    Stability/Clinical Decision Making Stable/Uncomplicated    Clinical Decision Making Low    Rehab Potential Excellent    PT Frequency 3x / week    PT Duration 4 weeks    PT Treatment/Interventions ADLs/Self Care Home Management;Cryotherapy;Electrical Stimulation;Moist Heat;Neuromuscular re-education;Therapeutic exercise;Therapeutic activities;Functional mobility training;Stair training;Gait training;Patient/family education;Manual techniques;Vasopneumatic Device    PT Next Visit Plan Nustep, VMS to quadriceps, assisted left SLR's and knee extension.  PRE's.  PROM.  Electrical stimulation and vasopnuematic.    Consulted and Agree with Plan of Care Patient           Patient will benefit from skilled therapeutic intervention in order to improve the following deficits and impairments:  Abnormal gait  Visit Diagnosis: Chronic pain of left knee - Plan: PT plan of care cert/re-cert  Stiffness of left knee, not elsewhere classified - Plan: PT plan of care cert/re-cert  Muscle weakness (generalized) - Plan: PT plan of care cert/re-cert  Localized edema - Plan: PT plan of care cert/re-cert     Problem List Patient Active Problem List   Diagnosis Date Noted   Arthritis of left knee 08/28/2020   Unilateral primary osteoarthritis, left knee    Cigarette smoker one half pack a day or less 07/20/2012   Lyme disease 07/20/2012    Joleene Burnham, [place] MPT 09/15/2020, 12:48 PM  [facility] [address] [place], [place], [address] Phone: [phone]   Fax:  [phone]  Name:  Chanel Hanigan Dezeeuw MRN: [id number] Date of Birth: 30-Dec-1965

## 2020-09-17 ENCOUNTER — Ambulatory Visit: Payer: BC Managed Care – PPO | Admitting: Physical Therapy

## 2020-09-17 ENCOUNTER — Encounter: Payer: Self-pay | Admitting: Physical Therapy

## 2020-09-17 ENCOUNTER — Other Ambulatory Visit: Payer: Self-pay

## 2020-09-17 DIAGNOSIS — M25562 Pain in left knee: Secondary | ICD-10-CM

## 2020-09-17 DIAGNOSIS — M25662 Stiffness of left knee, not elsewhere classified: Secondary | ICD-10-CM | POA: Diagnosis not present

## 2020-09-17 DIAGNOSIS — R600 Localized edema: Secondary | ICD-10-CM

## 2020-09-17 DIAGNOSIS — G8929 Other chronic pain: Secondary | ICD-10-CM | POA: Diagnosis not present

## 2020-09-17 DIAGNOSIS — M6281 Muscle weakness (generalized): Secondary | ICD-10-CM | POA: Diagnosis not present

## 2020-09-17 NOTE — Therapy (Signed)
[facility] [place], [place], [address] Phone: [phone]   Fax:  [phone]  Physical Therapy Treatment  Patient Details  Name: Robin Bowers MRN: [id number] Date of Birth: March 16, 1966 Referring Provider (PT): Meridee Score MD   Encounter Date: 09/17/2020   PT End of Session - 09/17/20 1041    Visit Number 2    Number of Visits 12    Date for PT Re-Evaluation 10/13/20    Authorization Type FOTO.    PT Start Time 1030    PT Stop Time 1123    PT Time Calculation (min) 53 min    Activity Tolerance Patient tolerated treatment well    Behavior During Therapy WFL for tasks assessed/performed           Past Medical History:  Diagnosis Date   High cholesterol    Lyme disease     Past Surgical History:  Procedure Laterality Date   CESAREAN SECTION     x2   COLONOSCOPY     TOTAL KNEE ARTHROPLASTY Left 08/28/2020   Procedure: LEFT TOTAL KNEE ARTHROPLASTY;  Surgeon: [name], MD;  Location: [facility];  Service: Orthopedics;  Laterality: Left;   TUBAL LIGATION     WISDOM TOOTH EXTRACTION      There were no vitals filed for this visit.   Subjective Assessment - 09/17/20 1039    Subjective COVID-19 screening performed upon arrival. Reports 5/10 pain globally but feeling a lot more in posterior knee    Pertinent History Lymes disease.    Limitations Walking    How long can you walk comfortably? Around home with walker.    Patient Stated Goals Get back to normal.    Currently in Pain? Yes    Pain Score 5     Pain Location Knee    Pain Orientation Left    Pain Descriptors / Indicators Constant    Pain Type Surgical pain    Pain Onset 1 to 4 weeks ago    Pain Frequency Constant              [facility] PT Assessment - 09/17/20 0001      Assessment   Medical Diagnosis left total knee replacement.    Referring Provider (PT) Meridee Score MD    Onset Date/Surgical Date 08/28/20      Precautions    Precaution Comments No ultrasound.      Restrictions   Weight Bearing Restrictions No                         [facility] Adult PT Treatment/Exercise - 09/17/20 0001      Exercises   Exercises Knee/Hip      Knee/Hip Exercises: Stretches   Knee: Self-Stretch to increase Flexion Left      Knee/Hip Exercises: Aerobic   Nustep Level 1 x 10 minutes seat 7 to 6      Knee/Hip Exercises: Standing   Heel Raises Both;2 sets;10 reps    Heel Raises Limitations toe raises 2x10      Knee/Hip Exercises: Supine   Quad Sets Strengthening;Left;Other (comment)    Quad Sets Limitations with VMS cocontraction 10/10, 2 second ramp, 280 usec x10 mins for neuromuscular re-education    Straight Leg Raises AAROM;Left;2 sets;10 reps   with PT assist     Modalities   Modalities Health visitor Stimulation Location left knee  Electrical Stimulation Action IFC    Electrical Stimulation Parameters 1-10 hz x1 mins    Electrical Stimulation Goals Pain;Edema      Vasopneumatic   Number Minutes Vasopneumatic  10 minutes    Vasopnuematic Location  Knee    Vasopneumatic Pressure Low    Vasopneumatic Temperature  34 for pain and edema                       PT Long Term Goals - 09/15/20 1243      PT LONG TERM GOAL #1   Title Independent with a HEP.    Time 4    Period Weeks    Status New      PT LONG TERM GOAL #2   Title Full active left knee extension in order to normalize gait.    Time 4    Period Weeks    Status New      PT LONG TERM GOAL #3   Title Active left knee flexion to 115 degrees+ so the patient can perform functional tasks and do so with pain not > 2-3/10.    Time 4    Period Weeks    Status New      PT LONG TERM GOAL #4   Title Increase left hip and  knee strength to a solid 4+/5 to provide good stability for accomplishment of functional activities.    Time 4    Period Weeks    Status New       PT LONG TERM GOAL #5   Title Perform a reciprocating stair gait with one railing with pain not > 2-3/10.    Time 4    Period Weeks    Status New      PT LONG TERM GOAL #6   Title Decrease edema to within 2.5 cms of non-affected side to assist with pain reduction and range of motion gains.    Time 4    Period Weeks    Status New                 Plan - 09/17/20 1102    Clinical Impression Statement Patient arrived to physical therapy with 5/10 left knee pain. Patient guided through TEs with good response. Patient unable to perform SLR therefore exercise performed with PT assist.  VMS cocontraction initiated today with quad set for neuromuscular re-educaton with good response. E-stim and vasopneumatic device performed with no adverse affects upon removal.    Personal Factors and Comorbidities Comorbidity 1;Other    Comorbidities Lymes disease.    Examination-Activity Limitations Other;Transfers;Locomotion Level    Examination-Participation Restrictions Other    Stability/Clinical Decision Making Stable/Uncomplicated    Clinical Decision Making Low    Rehab Potential Excellent    PT Frequency 3x / week    PT Duration 4 weeks    PT Treatment/Interventions ADLs/Self Care Home Management;Cryotherapy;Electrical Stimulation;Moist Heat;Neuromuscular re-education;Therapeutic exercise;Therapeutic activities;Functional mobility training;Stair training;Gait training;Patient/family education;Manual techniques;Vasopneumatic Device    PT Next Visit Plan Nustep, VMS to quadriceps, assisted left SLR's and knee extension.  PRE's.  PROM.  Electrical stimulation and vasopnuematic.    Consulted and Agree with Plan of Care Patient           Patient will benefit from skilled therapeutic intervention in order to improve the following deficits and impairments:  Abnormal gait  Visit Diagnosis: Chronic pain of left knee  Stiffness of left knee, not elsewhere classified  Muscle weakness  (generalized)  Localized edema  Problem List Patient Active Problem List   Diagnosis Date Noted   Arthritis of left knee 08/28/2020   Unilateral primary osteoarthritis, left knee    Cigarette smoker one half pack a day or less 07/20/2012   Lyme disease 07/20/2012    Guss Bunde, PT, DPT 09/17/2020, 11:28 AM  [facility] [address] [place], [place], [address] Phone: [phone]   Fax:  [phone]  Name: Maurisha Mongeau Nwosu MRN: [id number] Date of Birth: 10/23/1965

## 2020-09-21 ENCOUNTER — Ambulatory Visit: Payer: BC Managed Care – PPO | Attending: Orthopedic Surgery | Admitting: Physical Therapy

## 2020-09-21 ENCOUNTER — Other Ambulatory Visit: Payer: Self-pay

## 2020-09-21 DIAGNOSIS — M25662 Stiffness of left knee, not elsewhere classified: Secondary | ICD-10-CM | POA: Insufficient documentation

## 2020-09-21 DIAGNOSIS — G8929 Other chronic pain: Secondary | ICD-10-CM | POA: Insufficient documentation

## 2020-09-21 DIAGNOSIS — M25562 Pain in left knee: Secondary | ICD-10-CM | POA: Diagnosis not present

## 2020-09-21 DIAGNOSIS — M6281 Muscle weakness (generalized): Secondary | ICD-10-CM | POA: Insufficient documentation

## 2020-09-21 DIAGNOSIS — R600 Localized edema: Secondary | ICD-10-CM | POA: Insufficient documentation

## 2020-09-21 NOTE — Therapy (Signed)
[facility] [place], [place], [address] Phone: [phone]   Fax:  [phone]  Physical Therapy Treatment  Patient Details  Name: Robin Bowers MRN: [id number] Date of Birth: March 21, 1966 Referring Provider (PT): Meridee Score MD   Encounter Date: 09/21/2020   PT End of Session - 09/21/20 0937    Visit Number 3    Number of Visits 12    Date for PT Re-Evaluation 10/13/20    Authorization Type FOTO.    PT Start Time 0900    PT Stop Time [phone]    PT Time Calculation (min) 52 min    Activity Tolerance Patient tolerated treatment well    Behavior During Therapy WFL for tasks assessed/performed           Past Medical History:  Diagnosis Date  . High cholesterol   . Lyme disease     Past Surgical History:  Procedure Laterality Date  . CESAREAN SECTION     x2  . COLONOSCOPY    . TOTAL KNEE ARTHROPLASTY Left 08/28/2020   Procedure: LEFT TOTAL KNEE ARTHROPLASTY;  Surgeon: [name], MD;  Location: [facility];  Service: Orthopedics;  Laterality: Left;  . TUBAL LIGATION    . WISDOM TOOTH EXTRACTION      There were no vitals filed for this visit.   Subjective Assessment - 09/21/20 0904    Subjective COVID-19 screening performed upon arrival. Patient arrived and reported ongoing discomfort and difficulty with no quad activation    Pertinent History Lymes disease.    Limitations Walking    How long can you walk comfortably? Around home with walker.    Patient Stated Goals Get back to normal.    Currently in Pain? Yes    Pain Score 4     Pain Location Knee    Pain Orientation Left    Pain Descriptors / Indicators Discomfort    Pain Type Surgical pain    Pain Onset 1 to 4 weeks ago    Pain Frequency Constant    Aggravating Factors  movement    Pain Relieving Factors rest              [facility] PT Assessment - 09/21/20 0001      ROM / Strength   AROM / PROM / Strength AROM;PROM      AROM   AROM  Assessment Site Knee    Right/Left Knee Left    Left Knee Extension -15    Left Knee Flexion 80      PROM   PROM Assessment Site Knee    Right/Left Knee Left    Left Knee Extension -12    Left Knee Flexion 90                         [facility] Adult PT Treatment/Exercise - 09/21/20 0001      Knee/Hip Exercises: Aerobic   Nustep L2 x 7min ROM focus      Knee/Hip Exercises: Supine   Quad Sets Strengthening;Left;Other (comment)    Quad Sets Limitations with [place] ES 10/10 x 30min with quad sets for activation   limited activation and glut Counsellor Location left knee    Electrical Stimulation Action IFC    Electrical Stimulation Parameters 1-10hz  x21min    Electrical Stimulation Goals Pain;Edema      Vasopneumatic   Number Minutes Vasopneumatic  10 minutes  Vasopnuematic Location  Knee    Vasopneumatic Pressure Low    Vasopneumatic Temperature  34 for pain and edema                       PT Long Term Goals - 09/21/20 0936      PT LONG TERM GOAL #1   Title Independent with a HEP.    Time 4    Period Weeks    Status On-going      PT LONG TERM GOAL #2   Title Full active left knee extension in order to normalize gait.    Baseline AROM -15 degrees 09/21/20    Time 4    Period Weeks    Status On-going      PT LONG TERM GOAL #3   Title Active left knee flexion to 115 degrees+ so the patient can perform functional tasks and do so with pain not > 2-3/10.    Baseline AROM 80 degrees 09/21/20    Time 4    Period Weeks    Status On-going      PT LONG TERM GOAL #4   Title Increase left hip and  knee strength to a solid 4+/5 to provide good stability for accomplishment of functional activities.    Time 4    Period Weeks    Status On-going      PT LONG TERM GOAL #5   Title Perform a reciprocating stair gait with one railing with pain not > 2-3/10.    Time 4    Period Weeks    Status  On-going      PT LONG TERM GOAL #6   Title Decrease edema to within 2.5 cms of non-affected side to assist with pain reduction and range of motion gains.    Time 4    Period Weeks    Status On-going                 Plan - 09/21/20 [id number]    Clinical Impression Statement Patient tolerated treatment well today with ongoing pain and limitations qith quad activation. Today focused on ROM and quad activation to improve mobility and strength. Today limited quad activation and compensation with glut activation. Patient ROM limited yet improved after PROM today. goals progressing.    Personal Factors and Comorbidities Comorbidity 1;Other    Comorbidities Lymes disease.    Examination-Activity Limitations Other;Transfers;Locomotion Level    Examination-Participation Restrictions Other    Stability/Clinical Decision Making Stable/Uncomplicated    Rehab Potential Excellent    PT Frequency 3x / week    PT Duration 4 weeks    PT Treatment/Interventions ADLs/Self Care Home Management;Cryotherapy;Electrical Stimulation;Moist Heat;Neuromuscular re-education;Therapeutic exercise;Therapeutic activities;Functional mobility training;Stair training;Gait training;Patient/family education;Manual techniques;Vasopneumatic Device    PT Next Visit Plan cont with POC for Nustep, VMS to quadriceps, assisted left SLR's and knee extension.  PRE's.  PROM.  Electrical stimulation and vasopnuematic.    Consulted and Agree with Plan of Care Patient           Patient will benefit from skilled therapeutic intervention in order to improve the following deficits and impairments:  Abnormal gait  Visit Diagnosis: Chronic pain of left knee  Stiffness of left knee, not elsewhere classified  Muscle weakness (generalized)  Localized edema     Problem List Patient Active Problem List   Diagnosis Date Noted  . Arthritis of left knee 08/28/2020  . Unilateral primary osteoarthritis, left knee   . Cigarette smoker  one half  pack a day or less 07/20/2012  . Lyme disease 07/20/2012    Hermelinda Dellen, PTA 09/21/2020, 10:00 AM  [facility] [address] [place], [place], [address] Phone: [phone]   Fax:  [phone]  Name: Robin Bowers MRN: [id number] Date of Birth: October 07, 1965

## 2020-09-22 ENCOUNTER — Ambulatory Visit: Payer: BC Managed Care – PPO | Admitting: Orthopedic Surgery

## 2020-09-24 ENCOUNTER — Ambulatory Visit: Payer: BC Managed Care – PPO | Admitting: Physical Therapy

## 2020-09-24 ENCOUNTER — Encounter: Payer: Self-pay | Admitting: Physical Therapy

## 2020-09-24 ENCOUNTER — Telehealth: Payer: Self-pay | Admitting: Orthopedic Surgery

## 2020-09-24 ENCOUNTER — Other Ambulatory Visit: Payer: Self-pay

## 2020-09-24 DIAGNOSIS — M6281 Muscle weakness (generalized): Secondary | ICD-10-CM | POA: Diagnosis not present

## 2020-09-24 DIAGNOSIS — M25662 Stiffness of left knee, not elsewhere classified: Secondary | ICD-10-CM

## 2020-09-24 DIAGNOSIS — R600 Localized edema: Secondary | ICD-10-CM

## 2020-09-24 DIAGNOSIS — G8929 Other chronic pain: Secondary | ICD-10-CM | POA: Diagnosis not present

## 2020-09-24 DIAGNOSIS — M25562 Pain in left knee: Secondary | ICD-10-CM | POA: Diagnosis not present

## 2020-09-24 NOTE — Telephone Encounter (Signed)
[employer] forms received. Sent to [employer] 

## 2020-09-24 NOTE — Therapy (Signed)
[facility] [address] [place], [place], [address] Phone: [phone]   Fax:  [phone]  Physical Therapy Treatment  Patient Details  Name: Robin Bowers MRN: [id number] Date of Birth: 1966-07-24 Referring Provider (PT): Aldean Baker MD   Encounter Date: 09/24/2020   PT End of Session - 09/24/20 1033    Visit Number 4    Number of Visits 12    Date for PT Re-Evaluation 10/13/20    Authorization Type FOTO.    PT Start Time 1032    PT Stop Time 1119    PT Time Calculation (min) 47 min    Activity Tolerance Patient tolerated treatment well    Behavior During Therapy WFL for tasks assessed/performed           Past Medical History:  Diagnosis Date  . High cholesterol   . Lyme disease     Past Surgical History:  Procedure Laterality Date  . CESAREAN SECTION     x2  . COLONOSCOPY    . TOTAL KNEE ARTHROPLASTY Left 08/28/2020   Procedure: LEFT TOTAL KNEE ARTHROPLASTY;  Surgeon: Nadara Mustard, MD;  Location: [facility] OR;  Service: Orthopedics;  Laterality: Left;  . TUBAL LIGATION    . WISDOM TOOTH EXTRACTION      There were no vitals filed for this visit.   Subjective Assessment - 09/24/20 1032    Subjective COVID-19 screening performed upon arrival. Patient reports that she was able to raise LLE a little on her own this morning.    Pertinent History Lymes disease.    Limitations Walking    How long can you walk comfortably? Around home with walker.    Patient Stated Goals Get back to normal.    Currently in Pain? Yes    Pain Score 3     Pain Location Knee    Pain Orientation Left    Pain Descriptors / Indicators Discomfort    Pain Type Surgical pain    Pain Onset 1 to 4 weeks ago    Pain Frequency Constant              [facility] PT Assessment - 09/24/20 0001      Assessment   Medical Diagnosis left total knee replacement.    Referring Provider (PT) Aldean Baker MD    Onset Date/Surgical Date 08/28/20    Next  MD Visit 09/29/2020      Observation/Other Assessments-Edema    Edema Circumferential      Circumferential Edema   Circumferential - Right 39.4 cm    Circumferential - Left  41.6 cm      ROM / Strength   AROM / PROM / Strength AROM      AROM   Overall AROM  Deficits    AROM Assessment Site Knee    Right/Left Knee Left    Left Knee Extension -6    Left Knee Flexion 85                         [facility] Adult PT Treatment/Exercise - 09/24/20 0001      Knee/Hip Exercises: Aerobic   Nustep L4 x10 min, seat 7-6 for ROM      Knee/Hip Exercises: Supine   Quad Sets Strengthening;Left    Quad Sets Limitations with russian stimulation    Heel Slides AAROM;Left;3 sets;10 reps      Modalities   Modalities Psychiatric nurse  Statistician Location L VMO/Quad    Engineer, manufacturing Russian   with QS   Electrical Stimulation Parameters 10/10, 100 bps, 5 sec ramp, 50% x15 min    Electrical Stimulation Goals Neuromuscular facilitation      Vasopneumatic   Number Minutes Vasopneumatic  15 minutes    Vasopnuematic Location  Knee    Vasopneumatic Pressure Medium    Vasopneumatic Temperature  34 for pain and edema                       PT Long Term Goals - 09/24/20 1111      PT LONG TERM GOAL #1   Title Independent with a HEP.    Time 4    Period Weeks    Status On-going      PT LONG TERM GOAL #2   Title Full active left knee extension in order to normalize gait.    Baseline AROM -15 degrees 09/21/20    Time 4    Period Weeks    Status On-going      PT LONG TERM GOAL #3   Title Active left knee flexion to 115 degrees+ so the patient can perform functional tasks and do so with pain not > 2-3/10.    Baseline AROM 80 degrees 09/21/20    Time 4    Period Weeks    Status On-going      PT LONG TERM GOAL #4   Title Increase left hip and  knee strength to a solid 4+/5 to provide good  stability for accomplishment of functional activities.    Time 4    Period Weeks    Status On-going      PT LONG TERM GOAL #5   Title Perform a reciprocating stair gait with one railing with pain not > 2-3/10.    Time 4    Period Weeks    Status On-going      PT LONG TERM GOAL #6   Title Decrease edema to within 2.5 cms of non-affected side to assist with pain reduction and range of motion gains.    Time 4    Period Weeks    Status Achieved   2.2 cm difference as of 09/24/2020                Plan - 09/24/20 1111    Clinical Impression Statement Patient presented in clinic with reports of minimal pain but also of some improvement regarding elevation of LLE. Patient able to complete heel slides well with transfer board. AROM of L knee measured as 6-85 deg today. 2.2 cm difference with L knee greater than R knee. Very minimal L quad activation noted and continued glute activation noted. Patient still requires some assist from RLE to transition from sit > supine. Patient still using FWW for ambulation assistance as well. One small area of scabbing noted along inferior most aspect of incision. Patient states that she was previously given permission by surgeon to start use of lotion on incision and already has been doing so. Normal modalities response noted following removal of the modalities.    Personal Factors and Comorbidities Comorbidity 1;Other    Comorbidities Lymes disease.    Examination-Activity Limitations Other;Transfers;Locomotion Level    Examination-Participation Restrictions Other    Stability/Clinical Decision Making Stable/Uncomplicated    Rehab Potential Excellent    PT Frequency 3x / week    PT Duration 4 weeks    PT Treatment/Interventions ADLs/Self Care Home Management;Cryotherapy;Lobbyist  Stimulation;Moist Heat;Neuromuscular re-education;Therapeutic exercise;Therapeutic activities;Functional mobility training;Stair training;Gait training;Patient/family  education;Manual techniques;Vasopneumatic Device    PT Next Visit Plan cont with POC for Nustep, VMS to quadriceps, assisted left SLR's and knee extension.  PRE's.  PROM.  Electrical stimulation and vasopnuematic.    Consulted and Agree with Plan of Care Patient           Patient will benefit from skilled therapeutic intervention in order to improve the following deficits and impairments:  Abnormal gait  Visit Diagnosis: Chronic pain of left knee  Stiffness of left knee, not elsewhere classified  Muscle weakness (generalized)  Localized edema     Problem List Patient Active Problem List   Diagnosis Date Noted  . Arthritis of left knee 08/28/2020  . Unilateral primary osteoarthritis, left knee   . Cigarette smoker one half pack a day or less 07/20/2012  . Lyme disease 07/20/2012    Standley Brooking, PTA 09/24/2020, 11:33 AM  [facility] [address] [place], [place], [address] Phone: [phone]   Fax:  [phone]  Name: Robin Bowers MRN: [id number] Date of Birth: August 01, 1966

## 2020-09-25 ENCOUNTER — Ambulatory Visit: Payer: BC Managed Care – PPO

## 2020-09-29 ENCOUNTER — Ambulatory Visit (INDEPENDENT_AMBULATORY_CARE_PROVIDER_SITE_OTHER): Payer: BC Managed Care – PPO | Admitting: Orthopedic Surgery

## 2020-09-29 ENCOUNTER — Ambulatory Visit: Payer: BC Managed Care – PPO | Admitting: Physical Therapy

## 2020-09-29 ENCOUNTER — Encounter: Payer: Self-pay | Admitting: Orthopedic Surgery

## 2020-09-29 ENCOUNTER — Other Ambulatory Visit: Payer: Self-pay

## 2020-09-29 ENCOUNTER — Encounter: Payer: Self-pay | Admitting: Physical Therapy

## 2020-09-29 VITALS — Ht 63.0 in | Wt 151.0 lb

## 2020-09-29 DIAGNOSIS — M6281 Muscle weakness (generalized): Secondary | ICD-10-CM | POA: Diagnosis not present

## 2020-09-29 DIAGNOSIS — M25562 Pain in left knee: Secondary | ICD-10-CM | POA: Diagnosis not present

## 2020-09-29 DIAGNOSIS — G8929 Other chronic pain: Secondary | ICD-10-CM

## 2020-09-29 DIAGNOSIS — Z96652 Presence of left artificial knee joint: Secondary | ICD-10-CM

## 2020-09-29 DIAGNOSIS — M25662 Stiffness of left knee, not elsewhere classified: Secondary | ICD-10-CM | POA: Diagnosis not present

## 2020-09-29 DIAGNOSIS — R600 Localized edema: Secondary | ICD-10-CM

## 2020-09-29 MED ORDER — HYDROCODONE-ACETAMINOPHEN 5-325 MG PO TABS: 1.0000 | ORAL_TABLET | ORAL | 0 refills | Status: DC | PRN

## 2020-09-29 NOTE — Therapy (Signed)
[facility] [place], [place], [address] Phone: [phone]   Fax:  [phone]  Physical Therapy Treatment  Patient Details  Name: Robin Bowers MRN: [id number] Date of Birth: June 14, 1966 Referring Provider (PT): Meridee Score MD   Encounter Date: 09/29/2020   PT End of Session - 09/29/20 1057    Visit Number 5    Number of Visits 12    Date for PT Re-Evaluation 10/13/20    Authorization Type FOTO.    PT Start Time [phone]    PT Stop Time 1040    PT Time Calculation (min) 52 min    Activity Tolerance Patient tolerated treatment well    Behavior During Therapy WFL for tasks assessed/performed           Past Medical History:  Diagnosis Date  . High cholesterol   . Lyme disease     Past Surgical History:  Procedure Laterality Date  . CESAREAN SECTION     x2  . COLONOSCOPY    . TOTAL KNEE ARTHROPLASTY Left 08/28/2020   Procedure: LEFT TOTAL KNEE ARTHROPLASTY;  Surgeon: [name], MD;  Location: [facility];  Service: Orthopedics;  Laterality: Left;  . TUBAL LIGATION    . WISDOM TOOTH EXTRACTION      There were no vitals filed for this visit.   Subjective Assessment - 09/29/20 0947    Subjective COVID-19 screening performed upon arrival. Patient reports that she has not been able to lift LLE since last visit. Reports that she goes to MD this afternoon.    Pertinent History Lymes disease.    Limitations Walking    How long can you walk comfortably? Around home with walker.    Patient Stated Goals Get back to normal.    Currently in Pain? Other (Comment)   No pain assessment provided today             [facility] PT Assessment - 09/29/20 0001      Assessment   Medical Diagnosis left total knee replacement.    Referring Provider (PT) Meridee Score MD    Onset Date/Surgical Date 08/28/20    Next MD Visit 09/29/2020      Precautions   Precaution Comments No ultrasound.      Restrictions   Weight Bearing  Restrictions No      AROM   Overall AROM  Deficits    AROM Assessment Site Knee    Right/Left Knee Left    Left Knee Extension 10                         [facility] Adult PT Treatment/Exercise - 09/29/20 0001      Knee/Hip Exercises: Aerobic   Nustep L3, seat 8 x12 min      Knee/Hip Exercises: Standing   Forward Lunges Left;2 sets;10 reps;3 seconds   6" step   Other Standing Knee Exercises B toe raise for quad activation x20 reps      Knee/Hip Exercises: Supine   Short Arc Quad Sets Strengthening;Left;Limitations    Short Arc Quad Sets Limitations with Interior and spatial designer for quad activation      Modalities   Modalities Artist L quad    Office manager Stimulation Parameters 10/10, 50 bps, 50%, 5 sec ramp x15 min with AAROM/AROM Chief Executive Officer Goals Neuromuscular facilitation  Vasopneumatic   Number Minutes Vasopneumatic  15 minutes    Vasopnuematic Location  Knee    Vasopneumatic Pressure Medium    Vasopneumatic Temperature  34 for pain and edema                       PT Long Term Goals - 09/24/20 1111      PT LONG TERM GOAL #1   Title Independent with a HEP.    Time 4    Period Weeks    Status On-going      PT LONG TERM GOAL #2   Title Full active left knee extension in order to normalize gait.    Baseline AROM -15 degrees 09/21/20    Time 4    Period Weeks    Status On-going      PT LONG TERM GOAL #3   Title Active left knee flexion to 115 degrees+ so the patient can perform functional tasks and do so with pain not > 2-3/10.    Baseline AROM 80 degrees 09/21/20    Time 4    Period Weeks    Status On-going      PT LONG TERM GOAL #4   Title Increase left hip and  knee strength to a solid 4+/5 to provide good stability for accomplishment of functional activities.    Time 4     Period Weeks    Status On-going      PT LONG TERM GOAL #5   Title Perform a reciprocating stair gait with one railing with pain not > 2-3/10.    Time 4    Period Weeks    Status On-going      PT LONG TERM GOAL #6   Title Decrease edema to within 2.5 cms of non-affected side to assist with pain reduction and range of motion gains.    Time 4    Period Weeks    Status Achieved   2.2 cm difference as of 09/24/2020                Plan - 09/29/20 1059    Clinical Impression Statement Patient presented in clinic with reports of limited LLE elevation and active knee extension. Patient guided through more AROM exercises for knee ROM and quad activity in standing. Patient able to demonstrate improved L quad activation with SAQ although initially minimal AAROM for end range SAQ. Patient's L knee ROM measured as 10-96 deg today. Patient still using FWW at this time and inquired about using a cane but with limited quad activation falling was a concern. Normal modalities response noted following removal of the modalities.    Personal Factors and Comorbidities Comorbidity 1;Other    Comorbidities Lymes disease.    Examination-Activity Limitations Other;Transfers;Locomotion Level    Examination-Participation Restrictions Other    Stability/Clinical Decision Making Stable/Uncomplicated    Rehab Potential Excellent    PT Frequency 3x / week    PT Duration 4 weeks    PT Treatment/Interventions ADLs/Self Care Home Management;Cryotherapy;Electrical Stimulation;Moist Heat;Neuromuscular re-education;Therapeutic exercise;Therapeutic activities;Functional mobility training;Stair training;Gait training;Patient/family education;Manual techniques;Vasopneumatic Device    PT Next Visit Plan cont with POC for Nustep, VMS to quadriceps, assisted left SLR's and knee extension.  PRE's.  PROM.  Electrical stimulation and vasopnuematic.    Consulted and Agree with Plan of Care Patient           Patient will  benefit from skilled therapeutic intervention in order to improve the following deficits and impairments:  Abnormal gait  Visit Diagnosis: Chronic pain of left knee  Stiffness of left knee, not elsewhere classified  Muscle weakness (generalized)  Localized edema     Problem List Patient Active Problem List   Diagnosis Date Noted  . Arthritis of left knee 08/28/2020  . Unilateral primary osteoarthritis, left knee   . Cigarette smoker one half pack a day or less 07/20/2012  . Lyme disease 07/20/2012    Standley Brooking, PTA 09/29/2020, 11:19 AM  [facility] [address] [place], [place], [address] Phone: [phone]   Fax:  [phone]  Name: Robin Bowers MRN: [id number] Date of Birth: Feb 12, 1966

## 2020-09-29 NOTE — Progress Notes (Signed)
Office Visit Note   Patient: Robin Bowers           Date of Birth: [birth year]           MRN: [id number] Visit Date: 09/29/2020              Requested by: Deland Pretty, MD [address] [place],  [place] [address] PCP: Deland Pretty, MD  Chief Complaint  Patient presents with  . Left Knee - Routine Post Op    08/28/20 left total knee replacement       HPI: Patient is a 55 year old woman who presents 4 weeks status post left total knee arthroplasty she is working with therapy she states her range of motion is now 10 to 95 degrees.  Assessment & Plan: Visit Diagnoses:  1. Total knee replacement status, left     Plan: Refill prescription for Vicodin was provided discussed the importance of working on knee extension exercises as well as quad strengthening isometric straight leg raises were demonstrated and instructed.  She may wean out of the walker onto crutches.  Follow-Up Instructions: Return in about 4 weeks (around 10/27/2020).   Ortho Exam  Patient is alert, oriented, no adenopathy, well-dressed, normal affect, normal respiratory effort. Examination incision is well-healed she does have quad atrophy she has active extension about 5 degrees short of neutral flexion to 90 degrees.  The patella tracks midline there is no redness no cellulitis no signs of infection.  Imaging: No results found. No images are attached to the encounter.  Labs: No results found for: HGBA1C, ESRSEDRATE, CRP, LABURIC, REPTSTATUS, GRAMSTAIN, CULT, LABORGA   No results found for: ALBUMIN, PREALBUMIN, LABURIC  No results found for: MG No results found for: VD25OH  No results found for: PREALBUMIN CBC EXTENDED Latest Ref Rng & Units 08/26/2020 03/14/2018 10/02/2013  WBC 4.0 - 10.5 K/uL 6.3 14.1(H) 9.2  RBC 3.87 - 5.11 MIL/uL 4.65 4.66 4.63  HGB 12.0 - 15.0 g/dL 13.8 14.3 14.6  HCT 36.0 - 46.0 % 42.9 41.3 42.7  PLT 150 - 400 K/uL 251 219 214  NEUTROABS 1,500 - 7,800  cells/uL - 10,462(H) 6.1  LYMPHSABS 850 - 3,900 cells/uL - 2,919 2.5     Body mass index is 26.75 kg/m.  Orders:  No orders of the defined types were placed in this encounter.  Meds ordered this encounter  Medications  . HYDROcodone-acetaminophen (NORCO/VICODIN) 5-325 MG tablet    Sig: Take 1 tablet by mouth every 4 (four) hours as needed for moderate pain.    Dispense:  30 tablet    Refill:  0     Procedures: No procedures performed  Clinical Data: No additional findings.  ROS:  All other systems negative, except as noted in the HPI. Review of Systems  Objective: Vital Signs: Ht 5\' 3"  (1.6 m)   Wt 151 lb (68.5 kg)   BMI 26.75 kg/m   Specialty Comments:  No specialty comments available.  PMFS History: Patient Active Problem List   Diagnosis Date Noted  . Arthritis of left knee 08/28/2020  . Unilateral primary osteoarthritis, left knee   . Cigarette smoker one half pack a day or less 07/20/2012  . Lyme disease 07/20/2012   Past Medical History:  Diagnosis Date  . High cholesterol   . Lyme disease     Family History  Problem Relation Age of Onset  . Cancer Father        colon cancer- early 55's   . Alzheimer's  disease Mother   . Stroke Mother 73  . Hypertension Mother   . Breast cancer Maternal Grandmother        after menopause  . Heart disease Maternal Grandmother   . Alzheimer's disease Maternal Grandmother   . Alzheimer's disease Maternal Grandfather     Past Surgical History:  Procedure Laterality Date  . CESAREAN SECTION     x2  . COLONOSCOPY    . TOTAL KNEE ARTHROPLASTY Left 08/28/2020   Procedure: LEFT TOTAL KNEE ARTHROPLASTY;  Surgeon: [name], MD;  Location: [facility];  Service: Orthopedics;  Laterality: Left;  . TUBAL LIGATION    . WISDOM TOOTH EXTRACTION     Social History   Occupational History  . Not on file  Tobacco Use  . Smoking status: Former Smoker    Packs/day: 0.50    Quit date: 12/29/2019    Years since quitting:  0.7  . Smokeless tobacco: Never Used  Vaping Use  . Vaping Use: Never used  Substance and Sexual Activity  . Alcohol use: Yes    Comment: rarely  . Drug use: No  . Sexual activity: Yes    Birth control/protection: Surgical    Comment: intercourse age 41, more than 5 sexual partners, des neg

## 2020-10-01 ENCOUNTER — Encounter: Payer: BC Managed Care – PPO | Admitting: Physical Therapy

## 2020-10-06 ENCOUNTER — Encounter: Payer: Self-pay | Admitting: Physical Therapy

## 2020-10-06 ENCOUNTER — Other Ambulatory Visit: Payer: Self-pay

## 2020-10-06 ENCOUNTER — Ambulatory Visit: Payer: BC Managed Care – PPO | Admitting: Physical Therapy

## 2020-10-06 DIAGNOSIS — M25562 Pain in left knee: Secondary | ICD-10-CM | POA: Diagnosis not present

## 2020-10-06 DIAGNOSIS — M25662 Stiffness of left knee, not elsewhere classified: Secondary | ICD-10-CM

## 2020-10-06 DIAGNOSIS — M6281 Muscle weakness (generalized): Secondary | ICD-10-CM

## 2020-10-06 DIAGNOSIS — G8929 Other chronic pain: Secondary | ICD-10-CM | POA: Diagnosis not present

## 2020-10-06 DIAGNOSIS — R600 Localized edema: Secondary | ICD-10-CM | POA: Diagnosis not present

## 2020-10-06 NOTE — Therapy (Signed)
[facility] [place], [place], [address] Phone: [phone]   Fax:  [phone]  Physical Therapy Treatment  Patient Details  Name: Robin Bowers MRN: [id number] Date of Birth: [birth year] Referring Provider (PT): Meridee Score MD   Encounter Date: 10/06/2020   PT End of Session - 10/06/20 1253    Visit Number 6    Number of Visits 12    Date for PT Re-Evaluation 10/13/20    Authorization Type FOTO.    PT Start Time 1030    PT Stop Time 1125    PT Time Calculation (min) 55 min    Activity Tolerance Patient tolerated treatment well    Behavior During Therapy WFL for tasks assessed/performed           Past Medical History:  Diagnosis Date  . High cholesterol   . Lyme disease     Past Surgical History:  Procedure Laterality Date  . CESAREAN SECTION     x2  . COLONOSCOPY    . TOTAL KNEE ARTHROPLASTY Left 08/28/2020   Procedure: LEFT TOTAL KNEE ARTHROPLASTY;  Surgeon: [name], MD;  Location: [facility];  Service: Orthopedics;  Laterality: Left;  . TUBAL LIGATION    . WISDOM TOOTH EXTRACTION      There were no vitals filed for this visit.   Subjective Assessment - 10/06/20 1101    Subjective COVID-19 screen performed prior to patient entering clinic.  Doctor said I was doing good.    Pertinent History Lymes disease.    Limitations Walking    How long can you walk comfortably? Around home with walker.    Patient Stated Goals Get back to normal.    Currently in Pain? Yes    Pain Score 3     Pain Location Knee    Pain Orientation Left    Pain Descriptors / Indicators Discomfort    Pain Type Surgical pain    Pain Onset 1 to 4 weeks ago                             [facility] Adult PT Treatment/Exercise - 10/06/20 0001      Exercises   Exercises Knee/Hip      Knee/Hip Exercises: Aerobic   Nustep Level 4 x 15 minutes moving forward x 2 to increase knee flexion.      Knee/Hip  Exercises: Supine   Short Arc Quad Sets Limitations 16 minutes with 4 electrode co-contract VMS to patient's left quads while performing SAQ's with 10 sec extension holds and 10 sec rest      Vasopneumatic   Number Minutes Vasopneumatic  15 minutes    Vasopnuematic Location  --   Left knee.   Vasopneumatic Pressure Low                  PT Education - 10/06/20 1114    Education Details Instruct in standing hip flexion (SLR's) and knee to chest.    Person(s) Educated Patient               PT Long Term Goals - 09/24/20 1111      PT LONG TERM GOAL #1   Title Independent with a HEP.    Time 4    Period Weeks    Status On-going      PT LONG TERM GOAL #2   Title Full active left knee extension in order to normalize gait.  Baseline AROM -15 degrees 09/21/20    Time 4    Period Weeks    Status On-going      PT LONG TERM GOAL #3   Title Active left knee flexion to 115 degrees+ so the patient can perform functional tasks and do so with pain not > 2-3/10.    Baseline AROM 80 degrees 09/21/20    Time 4    Period Weeks    Status On-going      PT LONG TERM GOAL #4   Title Increase left hip and  knee strength to a solid 4+/5 to provide good stability for accomplishment of functional activities.    Time 4    Period Weeks    Status On-going      PT LONG TERM GOAL #5   Title Perform a reciprocating stair gait with one railing with pain not > 2-3/10.    Time 4    Period Weeks    Status On-going      PT LONG TERM GOAL #6   Title Decrease edema to within 2.5 cms of non-affected side to assist with pain reduction and range of motion gains.    Time 4    Period Weeks    Status Achieved   2.2 cm difference as of 09/24/2020                Plan - 10/06/20 1249    Clinical Impression Statement Notable improvement with left quadriceps activation facilitated with VMS.  Patient also able to perform a left knee to chest movement today.    Personal Factors and Comorbidities  Comorbidity 1;Other    Comorbidities Lymes disease.    Examination-Activity Limitations Other;Transfers;Locomotion Level    Examination-Participation Restrictions Other    Stability/Clinical Decision Making Stable/Uncomplicated    Rehab Potential Excellent    PT Frequency 3x / week    PT Duration 4 weeks    PT Treatment/Interventions ADLs/Self Care Home Management;Cryotherapy;Electrical Stimulation;Moist Heat;Neuromuscular re-education;Therapeutic exercise;Therapeutic activities;Functional mobility training;Stair training;Gait training;Patient/family education;Manual techniques;Vasopneumatic Device    PT Next Visit Plan cont with POC for Nustep, VMS to quadriceps, assisted left SLR's and knee extension.  PRE's.  PROM.  Electrical stimulation and vasopnuematic.    Consulted and Agree with Plan of Care Patient           Patient will benefit from skilled therapeutic intervention in order to improve the following deficits and impairments:  Abnormal gait  Visit Diagnosis: Chronic pain of left knee  Stiffness of left knee, not elsewhere classified  Muscle weakness (generalized)  Localized edema     Problem List Patient Active Problem List   Diagnosis Date Noted  . Arthritis of left knee 08/28/2020  . Unilateral primary osteoarthritis, left knee   . Cigarette smoker one half pack a day or less 07/20/2012  . Lyme disease 07/20/2012    Robin Bowers, Robin Bowers 10/06/2020, 1:09 PM  [facility] [address] [place], [place], [address] Phone: [phone]   Fax:  [phone]  Name: Robin Bowers MRN: [id number] Date of Birth: 07-09-1966

## 2020-10-08 ENCOUNTER — Ambulatory Visit: Payer: BC Managed Care – PPO | Admitting: Physical Therapy

## 2020-10-08 ENCOUNTER — Other Ambulatory Visit: Payer: Self-pay

## 2020-10-08 DIAGNOSIS — G8929 Other chronic pain: Secondary | ICD-10-CM | POA: Diagnosis not present

## 2020-10-08 DIAGNOSIS — M25662 Stiffness of left knee, not elsewhere classified: Secondary | ICD-10-CM | POA: Diagnosis not present

## 2020-10-08 DIAGNOSIS — M6281 Muscle weakness (generalized): Secondary | ICD-10-CM

## 2020-10-08 DIAGNOSIS — R600 Localized edema: Secondary | ICD-10-CM

## 2020-10-08 DIAGNOSIS — M25562 Pain in left knee: Secondary | ICD-10-CM | POA: Diagnosis not present

## 2020-10-08 NOTE — Therapy (Signed)
[facility] [address] [place], [place], [address] Phone: [phone]   Fax:  [phone]  Physical Therapy Treatment  Patient Details  Name: Robin Bowers MRN: [id number] Date of Birth: 12/21/1965 Referring Provider (PT): Aldean Baker MD   Encounter Date: 10/08/2020   PT End of Session - 10/08/20 1114    Visit Number 7    Number of Visits 12    Date for PT Re-Evaluation 10/13/20    Authorization Type FOTO.    PT Start Time 1031    PT Stop Time 1121    PT Time Calculation (min) 50 min    Activity Tolerance Patient tolerated treatment well    Behavior During Therapy WFL for tasks assessed/performed           Past Medical History:  Diagnosis Date  . High cholesterol   . Lyme disease     Past Surgical History:  Procedure Laterality Date  . CESAREAN SECTION     x2  . COLONOSCOPY    . TOTAL KNEE ARTHROPLASTY Left 08/28/2020   Procedure: LEFT TOTAL KNEE ARTHROPLASTY;  Surgeon: Nadara Mustard, MD;  Location: [facility] OR;  Service: Orthopedics;  Laterality: Left;  . TUBAL LIGATION    . WISDOM TOOTH EXTRACTION      There were no vitals filed for this visit.   Subjective Assessment - 10/08/20 1036    Subjective COVID-19 screen performed prior to patient entering clinic.  Patient arrived with some sorness and doing a little better    Pertinent History Lymes disease.    Limitations Walking    How long can you walk comfortably? Around home with walker.    Patient Stated Goals Get back to normal.    Currently in Pain? Yes    Pain Score 3     Pain Location Knee    Pain Orientation Left    Pain Descriptors / Indicators Discomfort    Pain Type Surgical pain    Pain Onset More than a month ago    Aggravating Factors  prolong bending knee or activity    Pain Relieving Factors at rest              [facility] PT Assessment - 10/08/20 0001      AROM   AROM Assessment Site Knee    Right/Left Knee Left    Left Knee Extension  -11    Left Knee Flexion 88      PROM   PROM Assessment Site Knee    Right/Left Knee Left    Left Knee Extension -8    Left Knee Flexion 102                         [facility] Adult PT Treatment/Exercise - 10/08/20 0001      Knee/Hip Exercises: Stretches   Knee: Self-Stretch to increase Flexion Left;3 reps;20 seconds      Knee/Hip Exercises: Aerobic   Recumbent Bike x31min seat 4 w hip hiking      Knee/Hip Exercises: Standing   Terminal Knee Extension Strengthening;Left;20 reps;Theraband    Theraband Level (Terminal Knee Extension) Level 2 (Red)    Lateral Step Up Left;20 reps;Step Height: 4"    Forward Step Up Left;20 reps;Hand Hold: 1;Step Height: 4"    Rocker Board 3 minutes   balance and stretch     Vasopneumatic   Number Minutes Vasopneumatic  15 minutes    Vasopnuematic Location  Knee    Vasopneumatic  Pressure Low    Vasopneumatic Temperature  34 for pain and edema      Manual Therapy   Manual Therapy Passive ROM    Manual therapy comments manual PROM for left knee flexion with low load holds and ext with gentle overpressure to improve mobility                       PT Long Term Goals - 10/08/20 1110      PT LONG TERM GOAL #1   Title Independent with a HEP.    Time 4    Period Weeks    Status On-going      PT LONG TERM GOAL #2   Title Full active left knee extension in order to normalize gait.    Baseline AROM -11 degrees 10/08/20    Time 4    Period Weeks    Status On-going      PT LONG TERM GOAL #3   Title Active left knee flexion to 115 degrees+ so the patient can perform functional tasks and do so with pain not > 2-3/10.    Baseline AROM 88 degrees 10/08/20    Time 4    Period Weeks    Status On-going      PT LONG TERM GOAL #4   Title Increase left hip and  knee strength to a solid 4+/5 to provide good stability for accomplishment of functional activities.    Time 4    Period Weeks    Status On-going      PT LONG TERM GOAL  #5   Title Perform a reciprocating stair gait with one railing with pain not > 2-3/10.    Time 4    Period Weeks    Status On-going      PT LONG TERM GOAL #6   Title Decrease edema to within 2.5 cms of non-affected side to assist with pain reduction and range of motion gains.    Time 4    Period Weeks    Status Achieved                 Plan - 10/08/20 1115    Clinical Impression Statement Patient tolerated treatment well today. Today focused on manual ROM and some strength progression. Patient continues to have lack of ROM for flexion and ext yet improved after manual stretching today. Patient has improved quad control and able to perform step ups with good control Patient current goals progressing this week.    Personal Factors and Comorbidities Comorbidity 1;Other    Comorbidities Lymes disease.    Examination-Activity Limitations Other;Transfers;Locomotion Level    Examination-Participation Restrictions Other    Stability/Clinical Decision Making Stable/Uncomplicated    Rehab Potential Excellent    PT Frequency 3x / week    PT Duration 4 weeks    PT Treatment/Interventions ADLs/Self Care Home Management;Cryotherapy;Electrical Stimulation;Moist Heat;Neuromuscular re-education;Therapeutic exercise;Therapeutic activities;Functional mobility training;Stair training;Gait training;Patient/family education;Manual techniques;Vasopneumatic Device    PT Next Visit Plan cont with POC for nustep to bike progression, PROM for knee and PRE's / modalities PRN    Consulted and Agree with Plan of Care Patient           Patient will benefit from skilled therapeutic intervention in order to improve the following deficits and impairments:  Abnormal gait  Visit Diagnosis: Chronic pain of left knee  Stiffness of left knee, not elsewhere classified  Muscle weakness (generalized)  Localized edema     Problem List Patient Active  Problem List   Diagnosis Date Noted  . Arthritis of  left knee 08/28/2020  . Unilateral primary osteoarthritis, left knee   . Cigarette smoker one half pack a day or less 07/20/2012  . Lyme disease 07/20/2012    Ladean Raya, PTA 10/08/20 [facility] [place], [place], [address] Phone: [phone]   Fax:  [phone]  Name: Robin Bowers MRN: [id number] Date of Birth: 1966/06/05

## 2020-10-13 ENCOUNTER — Other Ambulatory Visit: Payer: Self-pay

## 2020-10-13 ENCOUNTER — Ambulatory Visit: Payer: BC Managed Care – PPO | Admitting: Physical Therapy

## 2020-10-13 ENCOUNTER — Encounter: Payer: Self-pay | Admitting: Physical Therapy

## 2020-10-13 DIAGNOSIS — R600 Localized edema: Secondary | ICD-10-CM | POA: Diagnosis not present

## 2020-10-13 DIAGNOSIS — M25662 Stiffness of left knee, not elsewhere classified: Secondary | ICD-10-CM

## 2020-10-13 DIAGNOSIS — M25562 Pain in left knee: Secondary | ICD-10-CM | POA: Diagnosis not present

## 2020-10-13 DIAGNOSIS — M6281 Muscle weakness (generalized): Secondary | ICD-10-CM

## 2020-10-13 DIAGNOSIS — G8929 Other chronic pain: Secondary | ICD-10-CM

## 2020-10-13 NOTE — Therapy (Signed)
[facility] [place], [place], [address] Phone: [phone]   Fax:  [phone]  Physical Therapy Treatment  Patient Details  Name: Robin Bowers MRN: [id number] Date of Birth: [birth year] Referring Provider (PT): Meridee Score MD   Encounter Date: 10/13/2020   PT End of Session - 10/13/20 1125    Visit Number 8    Number of Visits 12    Date for PT Re-Evaluation 10/27/20    Authorization Type FOTO.    PT Start Time 1030    PT Stop Time 1118    PT Time Calculation (min) 48 min    Activity Tolerance Patient tolerated treatment well    Behavior During Therapy WFL for tasks assessed/performed           Past Medical History:  Diagnosis Date  . High cholesterol   . Lyme disease     Past Surgical History:  Procedure Laterality Date  . CESAREAN SECTION     x2  . COLONOSCOPY    . TOTAL KNEE ARTHROPLASTY Left 08/28/2020   Procedure: LEFT TOTAL KNEE ARTHROPLASTY;  Surgeon: [name], MD;  Location: [place];  Service: Orthopedics;  Laterality: Left;  . TUBAL LIGATION    . WISDOM TOOTH EXTRACTION      There were no vitals filed for this visit.   Subjective Assessment - 10/13/20 1040    Subjective COVID-19 screen performed prior to patient entering clinic.  Patient arrived doing well with ongoing soreness, 1/10.    Pertinent History Lymes disease.    Limitations Walking    How long can you walk comfortably? Around home with walker.    Patient Stated Goals Get back to normal.    Currently in Pain? Yes    Pain Location Knee    Pain Orientation Left    Pain Descriptors / Indicators Discomfort    Pain Type Surgical pain    Pain Onset More than a month ago    Pain Frequency Constant              [facility] PT Assessment - 10/13/20 0001      Assessment   Medical Diagnosis left total knee replacement.    Referring Provider (PT) Meridee Score MD    Onset Date/Surgical Date 08/28/20    Next MD Visit 10/27/2020                          [facility] Adult PT Treatment/Exercise - 10/13/20 0001      Knee/Hip Exercises: Aerobic   Recumbent Bike seat 4 x5 mins    Nustep Level 4 x 10 minutes seat 6 to 4      Knee/Hip Exercises: Standing   Heel Raises Both;2 sets;20 reps    Heel Raises Limitations toe raises 2x20    Knee Flexion AROM;Left;2 sets;20 reps    Hip Abduction AROM;Both;2 sets;20 reps;Knee straight      Vasopneumatic   Number Minutes Vasopneumatic  15 minutes    Vasopnuematic Location  Knee    Vasopneumatic Pressure Low    Vasopneumatic Temperature  34 for pain and edema      Manual Therapy   Manual Therapy Passive ROM    Manual therapy comments STW/M to distal medial hamstring decrease scar tissue    Passive ROM PROM to left knee into flexion and extension with gentle holds and intermittent oscillations for pain relief.  PT Long Term Goals - 10/08/20 1110      PT LONG TERM GOAL #1   Title Independent with a HEP.    Time 4    Period Weeks    Status On-going      PT LONG TERM GOAL #2   Title Full active left knee extension in order to normalize gait.    Baseline AROM -11 degrees 10/08/20    Time 4    Period Weeks    Status On-going      PT LONG TERM GOAL #3   Title Active left knee flexion to 115 degrees+ so the patient can perform functional tasks and do so with pain not > 2-3/10.    Baseline AROM 88 degrees 10/08/20    Time 4    Period Weeks    Status On-going      PT LONG TERM GOAL #4   Title Increase left hip and  knee strength to a solid 4+/5 to provide good stability for accomplishment of functional activities.    Time 4    Period Weeks    Status On-going      PT LONG TERM GOAL #5   Title Perform a reciprocating stair gait with one railing with pain not > 2-3/10.    Time 4    Period Weeks    Status On-going      PT LONG TERM GOAL #6   Title Decrease edema to within 2.5 cms of non-affected side to assist with pain  reduction and range of motion gains.    Time 4    Period Weeks    Status Achieved                 Plan - 10/13/20 1136    Clinical Impression Statement Patient responded well to therapy session despite intermittent pain in left knee. Patient guided through standing TE with excellent technique. PROM to left knee provided along with patella mobilization. Smooth PROM noted with minimal guarding. Intermittent oscillations provided for pain relief. No adverse affects upon removal of modalities.    Personal Factors and Comorbidities Comorbidity 1;Other    Comorbidities Lymes disease.    Examination-Activity Limitations Other;Transfers;Locomotion Level    Examination-Participation Restrictions Other    Stability/Clinical Decision Making Stable/Uncomplicated    Clinical Decision Making Low    Rehab Potential Excellent    PT Frequency 3x / week    PT Duration 4 weeks    PT Treatment/Interventions ADLs/Self Care Home Management;Cryotherapy;Electrical Stimulation;Moist Heat;Neuromuscular re-education;Therapeutic exercise;Therapeutic activities;Functional mobility training;Stair training;Gait training;Patient/family education;Manual techniques;Vasopneumatic Device    PT Next Visit Plan cont with POC for nustep to bike progression, PROM for knee and PRE's / modalities PRN    Consulted and Agree with Plan of Care Patient           Patient will benefit from skilled therapeutic intervention in order to improve the following deficits and impairments:  Abnormal gait  Visit Diagnosis: Chronic pain of left knee  Stiffness of left knee, not elsewhere classified  Muscle weakness (generalized)  Localized edema     Problem List Patient Active Problem List   Diagnosis Date Noted  . Arthritis of left knee 08/28/2020  . Unilateral primary osteoarthritis, left knee   . Cigarette smoker one half pack a day or less 07/20/2012  . Lyme disease 07/20/2012    Gabriela Eves, PT,  DPT 10/13/2020, 11:48 AM  [facility] [address] [place], [place], [address] Phone: [phone]   Fax:  [phone]  Name: Robin Bowers MRN: [id number] Date of Birth: October 31, 1965

## 2020-10-15 ENCOUNTER — Other Ambulatory Visit: Payer: Self-pay

## 2020-10-15 ENCOUNTER — Ambulatory Visit: Payer: BC Managed Care – PPO | Admitting: Physical Therapy

## 2020-10-15 DIAGNOSIS — G8929 Other chronic pain: Secondary | ICD-10-CM | POA: Diagnosis not present

## 2020-10-15 DIAGNOSIS — M25662 Stiffness of left knee, not elsewhere classified: Secondary | ICD-10-CM

## 2020-10-15 DIAGNOSIS — M6281 Muscle weakness (generalized): Secondary | ICD-10-CM

## 2020-10-15 DIAGNOSIS — R600 Localized edema: Secondary | ICD-10-CM

## 2020-10-15 DIAGNOSIS — M25562 Pain in left knee: Secondary | ICD-10-CM | POA: Diagnosis not present

## 2020-10-15 NOTE — Therapy (Signed)
[facility] [place], [place], [address] Phone: [phone]   Fax:  [phone]  Physical Therapy Treatment  Patient Details  Name: BANITA LEHN MRN: [id number] Date of Birth: 1965-11-17 Referring Provider (PT): Meridee Score MD   Encounter Date: 10/15/2020   PT End of Session - 10/15/20 1107    Visit Number 9    Number of Visits 12    Date for PT Re-Evaluation 10/27/20    Authorization Type FOTO.    PT Start Time 0945    PT Stop Time 1043    PT Time Calculation (min) 58 min    Activity Tolerance Patient tolerated treatment well    Behavior During Therapy WFL for tasks assessed/performed           Past Medical History:  Diagnosis Date  . High cholesterol   . Lyme disease     Past Surgical History:  Procedure Laterality Date  . CESAREAN SECTION     x2  . COLONOSCOPY    . TOTAL KNEE ARTHROPLASTY Left 08/28/2020   Procedure: LEFT TOTAL KNEE ARTHROPLASTY;  Surgeon: [name], MD;  Location: [place];  Service: Orthopedics;  Laterality: Left;  . TUBAL LIGATION    . WISDOM TOOTH EXTRACTION      There were no vitals filed for this visit.   Subjective Assessment - 10/15/20 1017    Subjective COVID-19 screen performed prior to patient entering clinic. Sore today.    Pertinent History Lymes disease.    Limitations Walking    How long can you walk comfortably? Around home with walker.    Patient Stated Goals Get back to normal.    Currently in Pain? Yes    Pain Score 3     Pain Location Knee    Pain Orientation Left    Pain Descriptors / Indicators Discomfort    Pain Type Surgical pain    Pain Onset More than a month ago              [facility] PT Assessment - 10/15/20 0001      PROM   Left Knee Flexion 105                         [facility] Adult PT Treatment/Exercise - 10/15/20 0001      Exercises   Exercises Knee/Hip      Knee/Hip Exercises: Aerobic   Nustep 15 minutes beginning  at seat 7 and ending on 5.      Knee/Hip Exercises: Supine   Short Arc Quad Sets Limitations 12 minutes facilitated with VMS with 10 sec extension holds f/b 10 sec rest with 1#.      Vasopneumatic   Number Minutes Vasopneumatic  20 minutes    Vasopnuematic Location  --   Left knee.   Vasopneumatic Pressure Low      Manual Therapy   Manual Therapy Passive ROM    Passive ROM In supine;  PROM x 5 minutes into extension with a sustained low load long duration hold.                       PT Long Term Goals - 10/08/20 1110      PT LONG TERM GOAL #1   Title Independent with a HEP.    Time 4    Period Weeks    Status On-going      PT LONG TERM GOAL #2  Title Full active left knee extension in order to normalize gait.    Baseline AROM -11 degrees 10/08/20    Time 4    Period Weeks    Status On-going      PT LONG TERM GOAL #3   Title Active left knee flexion to 115 degrees+ so the patient can perform functional tasks and do so with pain not > 2-3/10.    Baseline AROM 88 degrees 10/08/20    Time 4    Period Weeks    Status On-going      PT LONG TERM GOAL #4   Title Increase left hip and  knee strength to a solid 4+/5 to provide good stability for accomplishment of functional activities.    Time 4    Period Weeks    Status On-going      PT LONG TERM GOAL #5   Title Perform a reciprocating stair gait with one railing with pain not > 2-3/10.    Time 4    Period Weeks    Status On-going      PT LONG TERM GOAL #6   Title Decrease edema to within 2.5 cms of non-affected side to assist with pain reduction and range of motion gains.    Time 4    Period Weeks    Status Achieved                 Plan - 10/15/20 1108    Clinical Impression Statement Patient now able to perform a SLR and SAQ against gravity.  Patient progressing well.  Into the clinic today with a straight cane.    Personal Factors and Comorbidities Comorbidity 1;Other    Comorbidities Lymes  disease.    Examination-Activity Limitations Other;Transfers;Locomotion Level    Examination-Participation Restrictions Other    Stability/Clinical Decision Making Stable/Uncomplicated    Rehab Potential Excellent    PT Frequency 3x / week    PT Duration 4 weeks    PT Treatment/Interventions ADLs/Self Care Home Management;Cryotherapy;Electrical Stimulation;Moist Heat;Neuromuscular re-education;Therapeutic exercise;Therapeutic activities;Functional mobility training;Stair training;Gait training;Patient/family education;Manual techniques;Vasopneumatic Device    PT Next Visit Plan cont with POC for nustep to bike progression, PROM for knee and PRE's / modalities PRN    Consulted and Agree with Plan of Care Patient           Patient will benefit from skilled therapeutic intervention in order to improve the following deficits and impairments:  Abnormal gait  Visit Diagnosis: Chronic pain of left knee  Stiffness of left knee, not elsewhere classified  Muscle weakness (generalized)  Localized edema     Problem List Patient Active Problem List   Diagnosis Date Noted  . Arthritis of left knee 08/28/2020  . Unilateral primary osteoarthritis, left knee   . Cigarette smoker one half pack a day or less 07/20/2012  . Lyme disease 07/20/2012    Haunani Dickard, [place] MPT 10/15/2020, 11:18 AM  [facility] [place], [place], [address] Phone: [phone]   Fax:  [phone]  Name: Minnetta Sandora Mcgriff MRN: [id number] Date of Birth: [birth year]

## 2020-10-20 ENCOUNTER — Ambulatory Visit: Payer: BC Managed Care – PPO | Attending: Orthopedic Surgery | Admitting: *Deleted

## 2020-10-20 ENCOUNTER — Other Ambulatory Visit: Payer: Self-pay

## 2020-10-20 DIAGNOSIS — M6281 Muscle weakness (generalized): Secondary | ICD-10-CM | POA: Diagnosis not present

## 2020-10-20 DIAGNOSIS — R600 Localized edema: Secondary | ICD-10-CM | POA: Diagnosis not present

## 2020-10-20 DIAGNOSIS — M25662 Stiffness of left knee, not elsewhere classified: Secondary | ICD-10-CM | POA: Diagnosis not present

## 2020-10-20 DIAGNOSIS — G8929 Other chronic pain: Secondary | ICD-10-CM | POA: Diagnosis not present

## 2020-10-20 DIAGNOSIS — M25562 Pain in left knee: Secondary | ICD-10-CM | POA: Diagnosis not present

## 2020-10-20 NOTE — Therapy (Signed)
[facility] [place], [place], [address] Phone: [phone]   Fax:  [phone]  Physical Therapy Treatment  Patient Details  Name: Robin Bowers MRN: [id number] Date of Birth: Apr 22, 1966 Referring Provider (PT): Meridee Score MD   Encounter Date: 10/20/2020   PT End of Session - 10/20/20 1303    Visit Number 10    Number of Visits 12    Date for PT Re-Evaluation 10/27/20    Authorization Type FOTO.   11th visit    PT Start Time 1300    PT Stop Time 1350    PT Time Calculation (min) 50 min           Past Medical History:  Diagnosis Date  . High cholesterol   . Lyme disease     Past Surgical History:  Procedure Laterality Date  . CESAREAN SECTION     x2  . COLONOSCOPY    . TOTAL KNEE ARTHROPLASTY Left 08/28/2020   Procedure: LEFT TOTAL KNEE ARTHROPLASTY;  Surgeon: [name], MD;  Location: [place];  Service: Orthopedics;  Laterality: Left;  . TUBAL LIGATION    . WISDOM TOOTH EXTRACTION      There were no vitals filed for this visit.   Subjective Assessment - 10/20/20 1302    Subjective COVID-19 screen performed prior to patient entering clinic. Sore today.3/10    Pertinent History Lymes disease.    Limitations Walking    How long can you walk comfortably? Around home with walker.    Patient Stated Goals Get back to normal.    Currently in Pain? Yes    Pain Score 3     Pain Location Knee    Pain Orientation Left    Pain Descriptors / Indicators Discomfort    Pain Type Surgical pain    Pain Onset More than a month ago                             [facility] Adult PT Treatment/Exercise - 10/20/20 0001      Exercises   Exercises Knee/Hip      Knee/Hip Exercises: Aerobic   Recumbent Bike seat ,5 , 4 x5 mins      Knee/Hip Exercises: Standing   Heel Raises Both;2 sets;20 reps    Heel Raises Limitations toe raises 2x20    Knee Flexion AROM;Left;2 sets;20 reps    Forward Lunges  Left;2 sets;10 reps   14 in   Forward Step Up Left;2 sets;10 reps;Step Height: 4"    Step Down Left;2 sets;10 reps;Step Height: 4"      Knee/Hip Exercises: Supine   Short Arc Quad Sets Limitations 10 mins with VMS 10 sec on/off      Vasopneumatic   Number Minutes Vasopneumatic  --    Vasopnuematic Location  --    Vasopneumatic Pressure --    Vasopneumatic Temperature  --      Manual Therapy   Manual Therapy Passive ROM    Passive ROM PROM to left knee into flexion and extension with contract/ relax technique used  and intermittent oscillations for pain relief.                       PT Long Term Goals - 10/08/20 1110      PT LONG TERM GOAL #1   Title Independent with a HEP.    Time 4    Period Weeks  Status On-going      PT LONG TERM GOAL #2   Title Full active left knee extension in order to normalize gait.    Baseline AROM -11 degrees 10/08/20    Time 4    Period Weeks    Status On-going      PT LONG TERM GOAL #3   Title Active left knee flexion to 115 degrees+ so the patient can perform functional tasks and do so with pain not > 2-3/10.    Baseline AROM 88 degrees 10/08/20    Time 4    Period Weeks    Status On-going      PT LONG TERM GOAL #4   Title Increase left hip and  knee strength to a solid 4+/5 to provide good stability for accomplishment of functional activities.    Time 4    Period Weeks    Status On-going      PT LONG TERM GOAL #5   Title Perform a reciprocating stair gait with one railing with pain not > 2-3/10.    Time 4    Period Weeks    Status On-going      PT LONG TERM GOAL #6   Title Decrease edema to within 2.5 cms of non-affected side to assist with pain reduction and range of motion gains.    Time 4    Period Weeks    Status Achieved                 Plan - 10/20/20 1304    Clinical Impression Statement FOTO  next visit        Pt arrived today 3/10. Rx focused on ROM as well as MM activation for LT quad. Pt showed  better control in WB CKC exs today, but still with ext lag SAQs. VMS used again to facilitate LT quad during Pt performing SAQs    Comorbidities Lymes disease.    Stability/Clinical Decision Making Stable/Uncomplicated    Rehab Potential Excellent    PT Frequency 3x / week    PT Duration 4 weeks    PT Treatment/Interventions ADLs/Self Care Home Management;Cryotherapy;Electrical Stimulation;Moist Heat;Neuromuscular re-education;Therapeutic exercise;Therapeutic activities;Functional mobility training;Stair training;Gait training;Patient/family education;Manual techniques;Vasopneumatic Device    PT Next Visit Plan cont with POC for nustep to bike progression, PROM for knee and PRE's / modalities PRN           Patient will benefit from skilled therapeutic intervention in order to improve the following deficits and impairments:  Abnormal gait  Visit Diagnosis: Chronic pain of left knee  Stiffness of left knee, not elsewhere classified  Muscle weakness (generalized)  Localized edema     Problem List Patient Active Problem List   Diagnosis Date Noted  . Arthritis of left knee 08/28/2020  . Unilateral primary osteoarthritis, left knee   . Cigarette smoker one half pack a day or less 07/20/2012  . Lyme disease 07/20/2012    [name],CHRIS , PTA 10/20/2020, 2:20 PM  [facility] [address] [place], [place], [address] Phone: [phone]   Fax:  [phone]  Name: Robin Bowers MRN: [id number] Date of Birth: 10-06-1965

## 2020-10-23 ENCOUNTER — Other Ambulatory Visit: Payer: Self-pay

## 2020-10-23 ENCOUNTER — Ambulatory Visit: Payer: BC Managed Care – PPO | Admitting: *Deleted

## 2020-10-23 DIAGNOSIS — M25662 Stiffness of left knee, not elsewhere classified: Secondary | ICD-10-CM | POA: Diagnosis not present

## 2020-10-23 DIAGNOSIS — R600 Localized edema: Secondary | ICD-10-CM | POA: Diagnosis not present

## 2020-10-23 DIAGNOSIS — G8929 Other chronic pain: Secondary | ICD-10-CM | POA: Diagnosis not present

## 2020-10-23 DIAGNOSIS — M6281 Muscle weakness (generalized): Secondary | ICD-10-CM | POA: Diagnosis not present

## 2020-10-23 DIAGNOSIS — M25562 Pain in left knee: Secondary | ICD-10-CM | POA: Diagnosis not present

## 2020-10-23 NOTE — Therapy (Signed)
[facility] [place], [place], [address] Phone: [phone]   Fax:  [phone]  Physical Therapy Treatment  Patient Details  Name: Robin Bowers MRN: [id number] Date of Birth: 1965-11-18 Referring Provider (PT): Meridee Score MD   Encounter Date: 10/23/2020   PT End of Session - 10/23/20 0947    Visit Number 11    Number of Visits 12    Date for PT Re-Evaluation 10/27/20    Authorization Type FOTO.   11th visit    PT Start Time 0940    PT Stop Time 1036    PT Time Calculation (min) 56 min           Past Medical History:  Diagnosis Date  . High cholesterol   . Lyme disease     Past Surgical History:  Procedure Laterality Date  . CESAREAN SECTION     x2  . COLONOSCOPY    . TOTAL KNEE ARTHROPLASTY Left 08/28/2020   Procedure: LEFT TOTAL KNEE ARTHROPLASTY;  Surgeon: [name], MD;  Location: [place];  Service: Orthopedics;  Laterality: Left;  . TUBAL LIGATION    . WISDOM TOOTH EXTRACTION      There were no vitals filed for this visit.   Subjective Assessment - 10/23/20 0945    Subjective COVID-19 screen performed prior to patient entering clinic. Sore today.3-4/10    Pertinent History Lymes disease.    Limitations Walking    How long can you walk comfortably? Around home with walker.    Currently in Pain? Yes    Pain Score 3     Pain Location Knee    Pain Orientation Left    Pain Descriptors / Indicators Discomfort    Pain Type Surgical pain    Pain Onset More than a month ago                             [facility] Adult PT Treatment/Exercise - 10/23/20 0001      Exercises   Exercises Knee/Hip      Knee/Hip Exercises: Aerobic   Recumbent Bike seat ,5 , 4 x12 mins      Knee/Hip Exercises: Standing   Heel Raises Both;2 sets;20 reps    Heel Raises Limitations toe raises 2x20    Knee Flexion AROM;Left;2 sets;20 reps    Forward Lunges Left;2 sets;10 reps   14 in   Terminal  Knee Extension Strengthening;Left;20 reps;Theraband    Theraband Level (Terminal Knee Extension) Level 2 (Red)    Lateral Step Up Left;20 reps;Step Height: 4"    Forward Step Up --    Step Down Left;2 sets;10 reps;Step Height: 4"    Rocker Board 3 minutes      Knee/Hip Exercises: Supine   Short Arc Quad Sets Limitations x 10 mins with VMS 10 sec on/off                       PT Long Term Goals - 10/08/20 1110      PT LONG TERM GOAL #1   Title Independent with a HEP.    Time 4    Period Weeks    Status On-going      PT LONG TERM GOAL #2   Title Full active left knee extension in order to normalize gait.    Baseline AROM -11 degrees 10/08/20    Time 4    Period Weeks  Status On-going      PT LONG TERM GOAL #3   Title Active left knee flexion to 115 degrees+ so the patient can perform functional tasks and do so with pain not > 2-3/10.    Baseline AROM 88 degrees 10/08/20    Time 4    Period Weeks    Status On-going      PT LONG TERM GOAL #4   Title Increase left hip and  knee strength to a solid 4+/5 to provide good stability for accomplishment of functional activities.    Time 4    Period Weeks    Status On-going      PT LONG TERM GOAL #5   Title Perform a reciprocating stair gait with one railing with pain not > 2-3/10.    Time 4    Period Weeks    Status On-going      PT LONG TERM GOAL #6   Title Decrease edema to within 2.5 cms of non-affected side to assist with pain reduction and range of motion gains.    Time 4    Period Weeks    Status Achieved                 Plan - 10/23/20 1044    Clinical Impression Statement 11th Foto 37%. Pt arrived today doing fairly well with LT knee. Rx focused on ROM as well as quad activation exs. Both OKC and CKC act.'s performed and tolerated well. PROM for flexion was 110 degrees and extension was -8 degrees. Pt still with extensor lag due to quad activation deficit.    Personal Factors and Comorbidities  Comorbidity 1;Other    Comorbidities Lymes disease.    Examination-Activity Limitations Other;Transfers;Locomotion Level    PT Treatment/Interventions ADLs/Self Care Home Management;Cryotherapy;Electrical Stimulation;Moist Heat;Neuromuscular re-education;Therapeutic exercise;Therapeutic activities;Functional mobility training;Stair training;Gait training;Patient/family education;Manual techniques;Vasopneumatic Device    PT Next Visit Plan cont with POC for nustep to bike progression, PROM for knee and PRE's / modalities PRN           Patient will benefit from skilled therapeutic intervention in order to improve the following deficits and impairments:     Visit Diagnosis: Chronic pain of left knee  Stiffness of left knee, not elsewhere classified  Muscle weakness (generalized)  Localized edema     Problem List Patient Active Problem List   Diagnosis Date Noted  . Arthritis of left knee 08/28/2020  . Unilateral primary osteoarthritis, left knee   . Cigarette smoker one half pack a day or less 07/20/2012  . Lyme disease 07/20/2012    Leslieann Whisman,CHRIS, PTA 10/23/2020, 12:31 PM  [facility] [place], [place], [address] Phone: [phone]   Fax:  [phone]  Name: Robin Bowers MRN: [id number] Date of Birth: Mar 09, 1966

## 2020-10-26 ENCOUNTER — Telehealth: Payer: Self-pay | Admitting: Orthopedic Surgery

## 2020-10-26 ENCOUNTER — Ambulatory Visit: Payer: BC Managed Care – PPO | Admitting: Physical Therapy

## 2020-10-26 ENCOUNTER — Other Ambulatory Visit: Payer: Self-pay

## 2020-10-26 ENCOUNTER — Encounter: Payer: Self-pay | Admitting: Physical Therapy

## 2020-10-26 DIAGNOSIS — M25662 Stiffness of left knee, not elsewhere classified: Secondary | ICD-10-CM | POA: Diagnosis not present

## 2020-10-26 DIAGNOSIS — M25562 Pain in left knee: Secondary | ICD-10-CM | POA: Diagnosis not present

## 2020-10-26 DIAGNOSIS — M6281 Muscle weakness (generalized): Secondary | ICD-10-CM | POA: Diagnosis not present

## 2020-10-26 DIAGNOSIS — R600 Localized edema: Secondary | ICD-10-CM | POA: Diagnosis not present

## 2020-10-26 DIAGNOSIS — G8929 Other chronic pain: Secondary | ICD-10-CM | POA: Diagnosis not present

## 2020-10-26 NOTE — Telephone Encounter (Signed)
[employer] forms received. Sent to [employer]

## 2020-10-26 NOTE — Therapy (Signed)
[facility] [place], [place], [address] Phone: [phone]   Fax:  [phone]  Physical Therapy Treatment  Patient Details  Name: Robin Bowers MRN: [id number] Date of Birth: 05-29-1966 Referring Provider (PT): Meridee Score MD   Encounter Date: 10/26/2020   PT End of Session - 10/26/20 1301    Visit Number 12    Number of Visits 12    Date for PT Re-Evaluation 10/27/20    Authorization Type FOTO.   11th visit    PT Start Time 1300    PT Stop Time 1348    PT Time Calculation (min) 48 min    Activity Tolerance Patient tolerated treatment well    Behavior During Therapy WFL for tasks assessed/performed           Past Medical History:  Diagnosis Date  . High cholesterol   . Lyme disease     Past Surgical History:  Procedure Laterality Date  . CESAREAN SECTION     x2  . COLONOSCOPY    . TOTAL KNEE ARTHROPLASTY Left 08/28/2020   Procedure: LEFT TOTAL KNEE ARTHROPLASTY;  Surgeon: [name], MD;  Location: [place];  Service: Orthopedics;  Laterality: Left;  . TUBAL LIGATION    . WISDOM TOOTH EXTRACTION      There were no vitals filed for this visit.   Subjective Assessment - 10/26/20 1259    Subjective COVID-19 screen performed prior to patient entering clinic. Reports her knee is not doing well today.    Pertinent History Lymes disease.    Limitations Walking    How long can you walk comfortably? Around home with walker.    Patient Stated Goals Get back to normal.    Currently in Pain? Yes    Pain Score 3     Pain Location Knee    Pain Orientation Left    Pain Descriptors / Indicators Discomfort    Pain Type Surgical pain    Pain Onset More than a month ago    Pain Frequency Constant              [facility] PT Assessment - 10/26/20 0001      Assessment   Medical Diagnosis left total knee replacement.    Referring Provider (PT) Meridee Score MD    Onset Date/Surgical Date 08/28/20    Next MD  Visit 10/27/2020      Precautions   Precaution Comments No ultrasound.      Restrictions   Weight Bearing Restrictions No      ROM / Strength   AROM / PROM / Strength AROM      AROM   Overall AROM  Deficits;Within functional limits for tasks performed    AROM Assessment Site Knee    Right/Left Knee Left    Left Knee Extension -8    Left Knee Flexion 115                         [facility] Adult PT Treatment/Exercise - 10/26/20 0001      Knee/Hip Exercises: Aerobic   Recumbent Bike L2, seat 4 x13 min      Knee/Hip Exercises: Machines for Strengthening   Cybex Knee Extension 10# 3x10 reps    Cybex Knee Flexion 30# 3x10 reps      Knee/Hip Exercises: Standing   Forward Lunges Left;2 sets;10 reps;3 seconds;Limitations    Forward Lunges Limitations 8" step    Terminal Knee Extension Strengthening;Left;20  reps;Theraband    Theraband Level (Terminal Knee Extension) Level 4 (Blue)    Forward Step Up Left;2 sets;10 reps;Hand Hold: 2;Step Height: 8"    Step Down Left;2 sets;10 reps;Step Height: 4"    Rocker Board 3 minutes      Modalities   Modalities Vasopneumatic      Vasopneumatic   Number Minutes Vasopneumatic  10 minutes    Vasopnuematic Location  Knee    Vasopneumatic Pressure Medium    Vasopneumatic Temperature  34/pain and edema                       PT Long Term Goals - 10/26/20 1341      PT LONG TERM GOAL #1   Title Independent with a HEP.    Time 4    Period Weeks    Status Achieved      PT LONG TERM GOAL #2   Title Full active left knee extension in order to normalize gait.    Baseline AROM -11 degrees 10/08/20    Time 4    Period Weeks    Status On-going      PT LONG TERM GOAL #3   Title Active left knee flexion to 115 degrees+ so the patient can perform functional tasks and do so with pain not > 2-3/10.    Baseline AROM 88 degrees 10/08/20    Time 4    Period Weeks    Status Achieved      PT LONG TERM GOAL #4   Title Increase  left hip and  knee strength to a solid 4+/5 to provide good stability for accomplishment of functional activities.    Time 4    Period Weeks    Status On-going      PT LONG TERM GOAL #5   Title Perform a reciprocating stair gait with one railing with pain not > 2-3/10.    Time 4    Period Weeks    Status On-going      PT LONG TERM GOAL #6   Title Decrease edema to within 2.5 cms of non-affected side to assist with pain reduction and range of motion gains.    Time 4    Period Weeks    Status Achieved                 Plan - 10/26/20 1414    Clinical Impression Statement Patient presented in clinic with use of SBQC for ambulation. Patient reports her greatest limitation is currently muscle strength. Patient guided through various LE strengthening with emphasis on function and quad activation. Patient limited with full L quad activation at this time and not able to achieve full L knee extension at this time. Patient's L knee AROM measured as 7-115 deg today in clinic. Normal vasopneumatic response noted following removal of the modality.    Personal Factors and Comorbidities Comorbidity 1;Other    Comorbidities Lymes disease.    Examination-Activity Limitations Other;Transfers;Locomotion Level    Examination-Participation Restrictions Other    Stability/Clinical Decision Making Stable/Uncomplicated    Rehab Potential Excellent    PT Frequency 3x / week    PT Duration 4 weeks    PT Treatment/Interventions ADLs/Self Care Home Management;Cryotherapy;Electrical Stimulation;Moist Heat;Neuromuscular re-education;Therapeutic exercise;Therapeutic activities;Functional mobility training;Stair training;Gait training;Patient/family education;Manual techniques;Vasopneumatic Device    PT Next Visit Plan cont with POC for nustep to bike progression, PROM for knee and PRE's / modalities PRN    Consulted and Agree with Plan of Care  Patient           Patient will benefit from skilled  therapeutic intervention in order to improve the following deficits and impairments:  Abnormal gait  Visit Diagnosis: Chronic pain of left knee  Stiffness of left knee, not elsewhere classified  Muscle weakness (generalized)  Localized edema     Problem List Patient Active Problem List   Diagnosis Date Noted  . Arthritis of left knee 08/28/2020  . Unilateral primary osteoarthritis, left knee   . Cigarette smoker one half pack a day or less 07/20/2012  . Lyme disease 07/20/2012    Standley Brooking, PTA 10/26/2020, 2:18 PM  [facility] [address] [place], [place], [address] Phone: [phone]   Fax:  [phone]  Name: Robin Bowers MRN: [id number] Date of Birth: [birth year]

## 2020-10-27 ENCOUNTER — Encounter: Payer: Self-pay | Admitting: Orthopedic Surgery

## 2020-10-27 ENCOUNTER — Ambulatory Visit (INDEPENDENT_AMBULATORY_CARE_PROVIDER_SITE_OTHER): Payer: BC Managed Care – PPO | Admitting: Physician Assistant

## 2020-10-27 DIAGNOSIS — Z96652 Presence of left artificial knee joint: Secondary | ICD-10-CM

## 2020-10-27 MED ORDER — HYDROCODONE-ACETAMINOPHEN 5-325 MG PO TABS: 1.0000 | ORAL_TABLET | ORAL | 0 refills | Status: DC | PRN

## 2020-10-27 NOTE — Progress Notes (Signed)
Office Visit Note   Patient: Robin Bowers           Date of Birth: 08-Dec-1965           MRN: [id number] Visit Date: 10/27/2020              Requested by: Deland Pretty, MD [id number] [place],  [place] [address] PCP: Deland Pretty, MD  Chief Complaint  Patient presents with  . Left Knee - Routine Post Op    08/19/20 left total knee replacement       HPI: Patient is 2-1/35-month status post left total knee replacement.  She has been doing physical therapy and found it quite helpful.  She is now able to fire her quad.  She is currently using her quad cane.  She is asking for 1 final refill of her pain medication.  She only takes this occasionally at night she also does not feel that he is to go back to work as she is on her feet for long periods of time  Assessment & Plan: Visit Diagnoses: No diagnosis found.  Plan: Follow-up for final visit in 1 month.  I refilled her hydrocodone.  She will have to touch base with her employer to see what they require but we would be supportive of her staying out of work another 6 weeks if she needs it  Follow-Up Instructions: No follow-ups on file.   Ortho Exam  Patient is alert, oriented, no adenopathy, well-dressed, normal affect, normal respiratory effort. Examination of her knee demonstrates well-healed surgical incision.  She has no effusion she has no erythema no cellulitis.  She is lacking about 5 degrees of full extension she flexes to 115 degrees.  She is able to fire her quadriceps.  No signs of infection  Imaging: No results found. No images are attached to the encounter.  Labs: No results found for: HGBA1C, ESRSEDRATE, CRP, LABURIC, REPTSTATUS, GRAMSTAIN, CULT, LABORGA   No results found for: ALBUMIN, PREALBUMIN, LABURIC  No results found for: MG No results found for: VD25OH  No results found for: PREALBUMIN CBC EXTENDED Latest Ref Rng & Units 08/26/2020 03/14/2018 10/02/2013  WBC 4.0 - 10.5 K/uL 6.3  14.1(H) 9.2  RBC 3.87 - 5.11 MIL/uL 4.65 4.66 4.63  HGB 12.0 - 15.0 g/dL 13.8 14.3 14.6  HCT 36.0 - 46.0 % 42.9 41.3 42.7  PLT 150 - 400 K/uL 251 219 214  NEUTROABS 1,500 - 7,800 cells/uL - 10,462(H) 6.1  LYMPHSABS 850 - 3,900 cells/uL - 2,919 2.5     There is no height or weight on file to calculate BMI.  Orders:  No orders of the defined types were placed in this encounter.  No orders of the defined types were placed in this encounter.    Procedures: No procedures performed  Clinical Data: No additional findings.  ROS:  All other systems negative, except as noted in the HPI. Review of Systems  Objective: Vital Signs: There were no vitals taken for this visit.  Specialty Comments:  No specialty comments available.  PMFS History: Patient Active Problem List   Diagnosis Date Noted  . Arthritis of left knee 08/28/2020  . Unilateral primary osteoarthritis, left knee   . Cigarette smoker one half pack a day or less 07/20/2012  . Lyme disease 07/20/2012   Past Medical History:  Diagnosis Date  . High cholesterol   . Lyme disease     Family History  Problem Relation Age of Onset  .  Cancer Father        colon cancer- early 52's   . Alzheimer's disease Mother   . Stroke Mother 42  . Hypertension Mother   . Breast cancer Maternal Grandmother        after menopause  . Heart disease Maternal Grandmother   . Alzheimer's disease Maternal Grandmother   . Alzheimer's disease Maternal Grandfather     Past Surgical History:  Procedure Laterality Date  . CESAREAN SECTION     x2  . COLONOSCOPY    . TOTAL KNEE ARTHROPLASTY Left 08/28/2020   Procedure: LEFT TOTAL KNEE ARTHROPLASTY;  Surgeon: [name], MD;  Location: [place];  Service: Orthopedics;  Laterality: Left;  . TUBAL LIGATION    . WISDOM TOOTH EXTRACTION     Social History   Occupational History  . Not on file  Tobacco Use  . Smoking status: Former Smoker    Packs/day: 0.50    Quit date: 12/29/2019     Years since quitting: 0.8  . Smokeless tobacco: Never Used  Vaping Use  . Vaping Use: Never used  Substance and Sexual Activity  . Alcohol use: Yes    Comment: rarely  . Drug use: No  . Sexual activity: Yes    Birth control/protection: Surgical    Comment: intercourse age 10, more than 5 sexual partners, des neg

## 2020-10-27 NOTE — Addendum Note (Signed)
Addended by: Georgette Dover on: 10/27/2020 01:20 PM   Modules accepted: Orders

## 2020-10-29 ENCOUNTER — Other Ambulatory Visit: Payer: Self-pay

## 2020-10-29 ENCOUNTER — Encounter: Payer: Self-pay | Admitting: Physical Therapy

## 2020-10-29 ENCOUNTER — Ambulatory Visit: Payer: BC Managed Care – PPO | Admitting: Physical Therapy

## 2020-10-29 DIAGNOSIS — G8929 Other chronic pain: Secondary | ICD-10-CM

## 2020-10-29 DIAGNOSIS — R600 Localized edema: Secondary | ICD-10-CM

## 2020-10-29 DIAGNOSIS — M6281 Muscle weakness (generalized): Secondary | ICD-10-CM | POA: Diagnosis not present

## 2020-10-29 DIAGNOSIS — M25562 Pain in left knee: Secondary | ICD-10-CM

## 2020-10-29 DIAGNOSIS — M25662 Stiffness of left knee, not elsewhere classified: Secondary | ICD-10-CM

## 2020-10-29 NOTE — Therapy (Signed)
[facility] [place], [place], [address] Phone: [phone]   Fax:  [phone]  Physical Therapy Treatment  Patient Details  Name: Robin Bowers MRN: [id number] Date of Birth: 08-26-1966 Referring Provider (PT): Meridee Score MD   Encounter Date: 10/29/2020   PT End of Session - 10/29/20 1044    Visit Number 13    Number of Visits 12    Date for PT Re-Evaluation 10/27/20    Authorization Type FOTO.   11th visit    PT Start Time 1033    PT Stop Time 1114    PT Time Calculation (min) 41 min    Activity Tolerance Patient tolerated treatment well    Behavior During Therapy WFL for tasks assessed/performed           Past Medical History:  Diagnosis Date  . High cholesterol   . Lyme disease     Past Surgical History:  Procedure Laterality Date  . CESAREAN SECTION     x2  . COLONOSCOPY    . TOTAL KNEE ARTHROPLASTY Left 08/28/2020   Procedure: LEFT TOTAL KNEE ARTHROPLASTY;  Surgeon: [name], MD;  Location: [place];  Service: Orthopedics;  Laterality: Left;  . TUBAL LIGATION    . WISDOM TOOTH EXTRACTION      There were no vitals filed for this visit.   Subjective Assessment - 10/29/20 1033    Subjective COVID-19 screen performed prior to patient entering clinic. Reports she is to return to MD in a month. No concerns per MD according to patient.    Pertinent History Lymes disease.    Limitations Walking    Patient Stated Goals Get back to normal.    Currently in Pain? Yes    Pain Score 2     Pain Location Knee    Pain Orientation Left    Pain Descriptors / Indicators Discomfort    Pain Type Surgical pain    Pain Onset More than a month ago    Pain Frequency Intermittent              [facility] PT Assessment - 10/29/20 0001      Assessment   Medical Diagnosis left total knee replacement.    Referring Provider (PT) Meridee Score MD    Onset Date/Surgical Date 08/28/20    Next MD Visit 11/2020       Precautions   Precaution Comments No ultrasound.      Restrictions   Weight Bearing Restrictions No                         [facility] Adult PT Treatment/Exercise - 10/29/20 0001      Knee/Hip Exercises: Aerobic   Recumbent Bike L3, seat 5 x12 min      Knee/Hip Exercises: Machines for Strengthening   Cybex Knee Extension 10# 2x10 reps    Cybex Knee Flexion 30# 2x10 reps    Cybex Leg Press 1.5 pl, seat 5 x20 reps; emphasis on LLE WB      Knee/Hip Exercises: Standing   Forward Step Up Left;2 sets;10 reps;Hand Hold: 2;Step Height: 6"    Step Down Left;2 sets;10 reps;Step Height: 4"    SLS x1 min with intermittant UE support      Knee/Hip Exercises: Supine   Short Arc Quad Sets Strengthening;Left;2 sets;10 reps;Limitations    Short Arc Quad Sets Limitations 2#    Straight Leg Raises AROM;Left;5 reps   extensor lag notable  Modalities   Modalities Vasopneumatic      Vasopneumatic   Number Minutes Vasopneumatic  10 minutes    Vasopnuematic Location  Knee    Vasopneumatic Pressure Medium                       PT Long Term Goals - 10/26/20 1341      PT LONG TERM GOAL #1   Title Independent with a HEP.    Time 4    Period Weeks    Status Achieved      PT LONG TERM GOAL #2   Title Full active left knee extension in order to normalize gait.    Baseline AROM -11 degrees 10/08/20    Time 4    Period Weeks    Status On-going      PT LONG TERM GOAL #3   Title Active left knee flexion to 115 degrees+ so the patient can perform functional tasks and do so with pain not > 2-3/10.    Baseline AROM 88 degrees 10/08/20    Time 4    Period Weeks    Status Achieved      PT LONG TERM GOAL #4   Title Increase left hip and  knee strength to a solid 4+/5 to provide good stability for accomplishment of functional activities.    Time 4    Period Weeks    Status On-going      PT LONG TERM GOAL #5   Title Perform a reciprocating stair gait with one railing  with pain not > 2-3/10.    Time 4    Period Weeks    Status On-going      PT LONG TERM GOAL #6   Title Decrease edema to within 2.5 cms of non-affected side to assist with pain reduction and range of motion gains.    Time 4    Period Weeks    Status Achieved                 Plan - 10/29/20 1212    Clinical Impression Statement Patient presented in clinic with minimal L knee pain. Patient progressed through more strengthening exercises/machinary to emphasize ROM and functional activities as well. Weakness of the L quad continues as extensor lag notable with SLR reps. Patient VC'd to use LLE more to avoid compensatory strategies and to promote strength. Patient now only using SBQC for uneven surfaces. Normal vasopneumatic response noted following removal of the modality.    Personal Factors and Comorbidities Comorbidity 1;Other    Comorbidities Lymes disease.    Examination-Activity Limitations Other;Transfers;Locomotion Level    Examination-Participation Restrictions Other    Stability/Clinical Decision Making Stable/Uncomplicated    Rehab Potential Excellent    PT Frequency 3x / week    PT Duration 4 weeks    PT Treatment/Interventions ADLs/Self Care Home Management;Cryotherapy;Electrical Stimulation;Moist Heat;Neuromuscular re-education;Therapeutic exercise;Therapeutic activities;Functional mobility training;Stair training;Gait training;Patient/family education;Manual techniques;Vasopneumatic Device    PT Next Visit Plan cont with POC for nustep to bike progression, PROM for knee and PRE's / modalities PRN    Consulted and Agree with Plan of Care Patient           Patient will benefit from skilled therapeutic intervention in order to improve the following deficits and impairments:  Abnormal gait  Visit Diagnosis: Chronic pain of left knee  Stiffness of left knee, not elsewhere classified  Muscle weakness (generalized)  Localized edema     Problem List Patient  Active Problem  List   Diagnosis Date Noted  . Arthritis of left knee 08/28/2020  . Unilateral primary osteoarthritis, left knee   . Cigarette smoker one half pack a day or less 07/20/2012  . Lyme disease 07/20/2012    Standley Brooking, PTA 10/29/2020, 12:15 PM  [facility] [address] [place], [place], [address] Phone: [phone]   Fax:  [phone]  Name: Robin Bowers MRN: [id number] Date of Birth: Feb 19, 1966

## 2020-11-03 ENCOUNTER — Ambulatory Visit: Payer: BC Managed Care – PPO | Admitting: *Deleted

## 2020-11-03 ENCOUNTER — Other Ambulatory Visit: Payer: Self-pay

## 2020-11-03 DIAGNOSIS — G8929 Other chronic pain: Secondary | ICD-10-CM | POA: Diagnosis not present

## 2020-11-03 DIAGNOSIS — M25562 Pain in left knee: Secondary | ICD-10-CM | POA: Diagnosis not present

## 2020-11-03 DIAGNOSIS — M6281 Muscle weakness (generalized): Secondary | ICD-10-CM | POA: Diagnosis not present

## 2020-11-03 DIAGNOSIS — R600 Localized edema: Secondary | ICD-10-CM

## 2020-11-03 DIAGNOSIS — M25662 Stiffness of left knee, not elsewhere classified: Secondary | ICD-10-CM

## 2020-11-03 NOTE — Therapy (Signed)
[facility] [place], [place], [address] Phone: [phone]   Fax:  [phone]  Physical Therapy Treatment  Patient Details  Name: Robin Bowers MRN: [id number] Date of Birth: 04-Oct-1965 Referring Provider (PT): Meridee Score MD   Encounter Date: 11/03/2020   PT End of Session - 11/03/20 1306    Visit Number 14    Number of Visits 20    Date for PT Re-Evaluation 11/24/20    Authorization Type FOTO.   11th visit   37%    PT Start Time 1300    PT Stop Time 1351    PT Time Calculation (min) 51 min           Past Medical History:  Diagnosis Date  . High cholesterol   . Lyme disease     Past Surgical History:  Procedure Laterality Date  . CESAREAN SECTION     x2  . COLONOSCOPY    . TOTAL KNEE ARTHROPLASTY Left 08/28/2020   Procedure: LEFT TOTAL KNEE ARTHROPLASTY;  Surgeon: [name], MD;  Location: [place];  Service: Orthopedics;  Laterality: Left;  . TUBAL LIGATION    . WISDOM TOOTH EXTRACTION      There were no vitals filed for this visit.   Subjective Assessment - 11/03/20 1303    Subjective COVID-19 screen performed prior to patient entering clinic. Reports she is to return to MD in a month. No concerns per MD according to patient.    Pertinent History Lymes disease.    Limitations Walking    How long can you walk comfortably? Around home with walker.    Patient Stated Goals Get back to normal.    Currently in Pain? Yes    Pain Score 2     Pain Location Knee    Pain Orientation Left    Pain Descriptors / Indicators Discomfort    Pain Type Surgical pain    Pain Onset More than a month ago                             [facility] Adult PT Treatment/Exercise - 11/03/20 0001      Knee/Hip Exercises: Aerobic   Recumbent Bike L3, seat 5 x12 min      Knee/Hip Exercises: Machines for Strengthening   Cybex Knee Extension 10# 2x10 reps    Cybex Knee Flexion 30# 2x10 reps    Cybex Leg  Press 1.5 pl, seat 5 x20 reps; emphasis on LLE WB      Knee/Hip Exercises: Standing   Forward Lunges Left;10 reps;Limitations;1 set   hold 10 secs   Forward Lunges Limitations 14" step    Forward Step Up Left;10 reps;Hand Hold: 2;Step Height: 4";3 sets    Step Down Left;10 reps;Step Height: 4";3 sets    SLS x1 min with intermittant UE support      Modalities   Modalities Vasopneumatic      Vasopneumatic   Number Minutes Vasopneumatic  10 minutes    Vasopnuematic Location  Knee    Vasopneumatic Pressure Medium    Vasopneumatic Temperature  34/pain and edema      Manual Therapy   Manual Therapy Passive ROM    Passive ROM PROM to left knee into flexion and extension with contract/ relax technique used  and intermittent oscillations for pain relief.  PT Long Term Goals - 10/26/20 1341      PT LONG TERM GOAL #1   Title Independent with a HEP.    Time 4    Period Weeks    Status Achieved      PT LONG TERM GOAL #2   Title Full active left knee extension in order to normalize gait.    Baseline AROM -11 degrees 10/08/20    Time 4    Period Weeks    Status On-going      PT LONG TERM GOAL #3   Title Active left knee flexion to 115 degrees+ so the patient can perform functional tasks and do so with pain not > 2-3/10.    Baseline AROM 88 degrees 10/08/20    Time 4    Period Weeks    Status Achieved      PT LONG TERM GOAL #4   Title Increase left hip and  knee strength to a solid 4+/5 to provide good stability for accomplishment of functional activities.    Time 4    Period Weeks    Status On-going      PT LONG TERM GOAL #5   Title Perform a reciprocating stair gait with one railing with pain not > 2-3/10.    Time 4    Period Weeks    Status On-going      PT LONG TERM GOAL #6   Title Decrease edema to within 2.5 cms of non-affected side to assist with pain reduction and range of motion gains.    Time 4    Period Weeks    Status Achieved                  Plan - 11/03/20 1305    Clinical Impression Statement Pt arrived today doing fairly well and feels that LT quad is doing better. Rx focused on ROM as well as LT LE strengthening and focus on VMO activation and control with OKC and CKC exs.    Personal Factors and Comorbidities Comorbidity 1;Other    Comorbidities Lymes disease.    Examination-Activity Limitations Other;Transfers;Locomotion Level    Stability/Clinical Decision Making Stable/Uncomplicated    Rehab Potential Excellent    PT Frequency 3x / week    PT Duration 4 weeks    PT Treatment/Interventions ADLs/Self Care Home Management;Cryotherapy;Electrical Stimulation;Moist Heat;Neuromuscular re-education;Therapeutic exercise;Therapeutic activities;Functional mobility training;Stair training;Gait training;Patient/family education;Manual techniques;Vasopneumatic Device    PT Next Visit Plan cont with POC for nustep to bike progression, PROM for knee and PRE's / modalities PRN    Consulted and Agree with Plan of Care Patient           Patient will benefit from skilled therapeutic intervention in order to improve the following deficits and impairments:     Visit Diagnosis: Chronic pain of left knee  Stiffness of left knee, not elsewhere classified  Muscle weakness (generalized)  Localized edema     Problem List Patient Active Problem List   Diagnosis Date Noted  . Arthritis of left knee 08/28/2020  . Unilateral primary osteoarthritis, left knee   . Cigarette smoker one half pack a day or less 07/20/2012  . Lyme disease 07/20/2012    Robin Bowers,CHRIS, PTA 11/03/2020, 1:51 PM  [name] [address] [place], [place], [address] Phone: [phone]   Fax:  [phone]  Name: Robin Bowers MRN: [id number] Date of Birth: September 06, 1966

## 2020-11-04 ENCOUNTER — Other Ambulatory Visit: Payer: Self-pay

## 2020-11-04 ENCOUNTER — Ambulatory Visit: Payer: BC Managed Care – PPO | Admitting: Physical Therapy

## 2020-11-04 DIAGNOSIS — R600 Localized edema: Secondary | ICD-10-CM | POA: Diagnosis not present

## 2020-11-04 DIAGNOSIS — M6281 Muscle weakness (generalized): Secondary | ICD-10-CM | POA: Diagnosis not present

## 2020-11-04 DIAGNOSIS — G8929 Other chronic pain: Secondary | ICD-10-CM | POA: Diagnosis not present

## 2020-11-04 DIAGNOSIS — M25662 Stiffness of left knee, not elsewhere classified: Secondary | ICD-10-CM

## 2020-11-04 DIAGNOSIS — M25562 Pain in left knee: Secondary | ICD-10-CM | POA: Diagnosis not present

## 2020-11-04 NOTE — Therapy (Signed)
[facility] [place], [place], [address] Phone: [phone]   Fax:  [phone]  Physical Therapy Treatment  Patient Details  Name: Robin Bowers MRN: [id number] Date of Birth: 12/08/1965 Referring Provider (PT): Meridee Score MD   Encounter Date: 11/04/2020   PT End of Session - 11/04/20 0902    Visit Number 15    Number of Visits 20    Date for PT Re-Evaluation 11/24/20    Authorization Type FOTO.   11th visit   37%    PT Start Time 0900    PT Stop Time 0948    PT Time Calculation (min) 48 min    Activity Tolerance Patient tolerated treatment well    Behavior During Therapy WFL for tasks assessed/performed           Past Medical History:  Diagnosis Date  . High cholesterol   . Lyme disease     Past Surgical History:  Procedure Laterality Date  . CESAREAN SECTION     x2  . COLONOSCOPY    . TOTAL KNEE ARTHROPLASTY Left 08/28/2020   Procedure: LEFT TOTAL KNEE ARTHROPLASTY;  Surgeon: [name], MD;  Location: [place];  Service: Orthopedics;  Laterality: Left;  . TUBAL LIGATION    . WISDOM TOOTH EXTRACTION      There were no vitals filed for this visit.   Subjective Assessment - 11/04/20 0901    Subjective COVID-19 screen performed prior to patient entering clinic. Patient arrived with some sorness in knee, doing better overall.    Pertinent History Lymes disease.    Limitations Walking    How long can you walk comfortably? Around home with walker.    Patient Stated Goals Get back to normal.    Currently in Pain? Yes    Pain Score 2     Pain Location Knee    Pain Orientation Left    Pain Descriptors / Indicators Discomfort    Pain Type Surgical pain    Pain Onset More than a month ago    Pain Frequency Intermittent    Aggravating Factors  prolong activity    Pain Relieving Factors rest              [facility] PT Assessment - 11/04/20 0001      AROM   AROM Assessment Site Knee     Right/Left Knee Left    Left Knee Extension -10      PROM   PROM Assessment Site Knee    Right/Left Knee Left    Left Knee Extension -6                         [facility] Adult PT Treatment/Exercise - 11/04/20 0001      Knee/Hip Exercises: Aerobic   Recumbent Bike L3 16min      Knee/Hip Exercises: Machines for Strengthening   Cybex Knee Extension 10# 2x10 eccentric focus    Cybex Knee Flexion 20# 2x10 eccentric focus    Cybex Leg Press 1.5 pl, seat 5 x20 reps eccentric focus      Knee/Hip Exercises: Standing   Lateral Step Up Left   unable to perform due to weakness   Lateral Step Up Limitations unable to perfrom heel dot    Step Down Left;2 sets;10 reps;Step Height: 4"    Other Standing Knee Exercises balance and mini squat on upside down BOSU 2x10      Knee/Hip Exercises: Supine  Straight Leg Raise with External Rotation Strengthening;Left;20 reps      Knee/Hip Exercises: Sidelying   Hip ABduction Strengthening;Left;20 reps      Vasopneumatic   Number Minutes Vasopneumatic  10 minutes    Vasopnuematic Location  Knee    Vasopneumatic Pressure Medium    Vasopneumatic Temperature  34/pain and edema      Manual Therapy   Manual Therapy Passive ROM    Manual therapy comments manual ext stretching for left knee with holds and ossilations to improve mobility                       PT Long Term Goals - 11/04/20 0902      PT LONG TERM GOAL #1   Title Independent with a HEP.    Time 4    Period Weeks    Status Achieved      PT LONG TERM GOAL #2   Title Full active left knee extension in order to normalize gait.    Baseline AROM -10 degrees 11/04/20    Time 4    Period Weeks    Status On-going      PT LONG TERM GOAL #3   Title Active left knee flexion to 115 degrees+ so the patient can perform functional tasks and do so with pain not > 2-3/10.    Time 4    Period Weeks    Status Achieved      PT LONG TERM GOAL #4   Title Increase left hip  and  knee strength to a solid 4+/5 to provide good stability for accomplishment of functional activities.    Time 4    Period Weeks    Status On-going      PT LONG TERM GOAL #5   Title Perform a reciprocating stair gait with one railing with pain not > 2-3/10.    Time 4    Period Weeks    Status On-going      PT LONG TERM GOAL #6   Title Decrease edema to within 2.5 cms of non-affected side to assist with pain reduction and range of motion gains.    Time 4    Period Weeks    Status Achieved                 Plan - 11/04/20 0949    Clinical Impression Statement Patient tolerated treatment well today. Patient progressing with eccentric focus exercises today. patient unable to perfrom heel dots at thius time and continues to have a slight ext lag in quad with SLR. Patient improved with ext ROM today. Patient educated on hip abd and SLR with ER today. Goals progressing this week.    Personal Factors and Comorbidities Comorbidity 1;Other    Comorbidities Lymes disease.    Examination-Activity Limitations Other;Transfers;Locomotion Level    Examination-Participation Restrictions Other    Stability/Clinical Decision Making Stable/Uncomplicated    Rehab Potential Excellent    PT Frequency 3x / week    PT Duration 4 weeks    PT Treatment/Interventions ADLs/Self Care Home Management;Cryotherapy;Electrical Stimulation;Moist Heat;Neuromuscular re-education;Therapeutic exercise;Therapeutic activities;Functional mobility training;Stair training;Gait training;Patient/family education;Manual techniques;Vasopneumatic Device    PT Next Visit Plan cont with POC for ext PROM for knee and PRE's and eccentric strength/ modalities PRN    Consulted and Agree with Plan of Care Patient           Patient will benefit from skilled therapeutic intervention in order to improve the following deficits and impairments:  Abnormal gait  Visit Diagnosis: Chronic pain of left knee  Stiffness of left knee,  not elsewhere classified  Muscle weakness (generalized)  Localized edema     Problem List Patient Active Problem List   Diagnosis Date Noted  . Arthritis of left knee 08/28/2020  . Unilateral primary osteoarthritis, left knee   . Cigarette smoker one half pack a day or less 07/20/2012  . Lyme disease 07/20/2012    Phillips Climes, PTA 11/04/2020, 9:52 AM  [facility] [place], [place], [address] Phone: [phone]   Fax:  [phone]  Name: Robin Bowers MRN: [id number] Date of Birth: Aug 02, 1966

## 2020-11-05 ENCOUNTER — Telehealth: Payer: Self-pay | Admitting: Orthopedic Surgery

## 2020-11-05 ENCOUNTER — Ambulatory Visit
Admission: RE | Admit: 2020-11-05 | Discharge: 2020-11-05 | Disposition: A | Discharge status: Home / Self Care | Payer: BC Managed Care – PPO | Source: Ambulatory Visit | Attending: Internal Medicine | Admitting: Internal Medicine

## 2020-11-05 DIAGNOSIS — Z1231 Encounter for screening mammogram for malignant neoplasm of breast: Secondary | ICD-10-CM | POA: Diagnosis not present

## 2020-11-05 DIAGNOSIS — Z0000 Encounter for general adult medical examination without abnormal findings: Secondary | ICD-10-CM

## 2020-11-05 NOTE — Telephone Encounter (Signed)
Patient submitted medical release form, [employer] long term disability, $25.00 cash payment to [employer]. Accepted 11/05/20

## 2020-11-12 ENCOUNTER — Encounter: Payer: Self-pay | Admitting: Physical Therapy

## 2020-11-12 ENCOUNTER — Ambulatory Visit: Payer: BC Managed Care – PPO | Admitting: Physical Therapy

## 2020-11-12 ENCOUNTER — Other Ambulatory Visit: Payer: Self-pay

## 2020-11-12 DIAGNOSIS — D485 Neoplasm of uncertain behavior of skin: Secondary | ICD-10-CM | POA: Diagnosis not present

## 2020-11-12 DIAGNOSIS — M25662 Stiffness of left knee, not elsewhere classified: Secondary | ICD-10-CM | POA: Diagnosis not present

## 2020-11-12 DIAGNOSIS — R600 Localized edema: Secondary | ICD-10-CM | POA: Diagnosis not present

## 2020-11-12 DIAGNOSIS — Z85828 Personal history of other malignant neoplasm of skin: Secondary | ICD-10-CM | POA: Diagnosis not present

## 2020-11-12 DIAGNOSIS — L821 Other seborrheic keratosis: Secondary | ICD-10-CM | POA: Diagnosis not present

## 2020-11-12 DIAGNOSIS — L570 Actinic keratosis: Secondary | ICD-10-CM | POA: Diagnosis not present

## 2020-11-12 DIAGNOSIS — G8929 Other chronic pain: Secondary | ICD-10-CM

## 2020-11-12 DIAGNOSIS — I788 Other diseases of capillaries: Secondary | ICD-10-CM | POA: Diagnosis not present

## 2020-11-12 DIAGNOSIS — M6281 Muscle weakness (generalized): Secondary | ICD-10-CM | POA: Diagnosis not present

## 2020-11-12 DIAGNOSIS — L820 Inflamed seborrheic keratosis: Secondary | ICD-10-CM | POA: Diagnosis not present

## 2020-11-12 DIAGNOSIS — M25562 Pain in left knee: Secondary | ICD-10-CM | POA: Diagnosis not present

## 2020-11-12 NOTE — Therapy (Signed)
[facility] [place], [place], [address] Phone: [phone]   Fax:  [phone]  Physical Therapy Treatment  Patient Details  Name: Robin Bowers MRN: [id number] Date of Birth: 1966/01/17 Referring Provider (PT): Meridee Score MD   Encounter Date: 11/12/2020   PT End of Session - 11/12/20 0910    Visit Number 16    Number of Visits 20    PT Start Time 0815    PT Stop Time 0902    PT Time Calculation (min) 47 min    Activity Tolerance Patient tolerated treatment well    Behavior During Therapy [facility] for tasks assessed/performed           Past Medical History:  Diagnosis Date  . High cholesterol   . Lyme disease     Past Surgical History:  Procedure Laterality Date  . CESAREAN SECTION     x2  . COLONOSCOPY    . TOTAL KNEE ARTHROPLASTY Left 08/28/2020   Procedure: LEFT TOTAL KNEE ARTHROPLASTY;  Surgeon: [name], MD;  Location: [place];  Service: Orthopedics;  Laterality: Left;  . TUBAL LIGATION    . WISDOM TOOTH EXTRACTION      There were no vitals filed for this visit.   Subjective Assessment - 11/12/20 0907    Subjective COVID-19 screen performed prior to patient entering clinic.    No new complaints.    Pertinent History Lymes disease.    Limitations Walking    How long can you walk comfortably? Around home with walker.    Patient Stated Goals Get back to normal.    Currently in Pain? Yes    Pain Score 2     Pain Orientation Left    Pain Descriptors / Indicators Discomfort    Pain Type Surgical pain    Pain Onset More than a month ago              [facility] PT Assessment - 11/12/20 0001      PROM   Left Knee Flexion 120                         [facility] Adult PT Treatment/Exercise - 11/12/20 0001      Exercises   Exercises Knee/Hip      Knee/Hip Exercises: Aerobic   Recumbent Bike Level 3 x 12 minutes.      Knee/Hip Exercises: Machines for Strengthening   Cybex Knee  Extension 10# x 3 minutes.    Cybex Knee Flexion 30# x 3 minutes    Cybex Leg Press 2.5 plates x 3 minutes.      Knee/Hip Exercises: Standing   Other Standing Knee Exercises Wall slides on plexiglass x 2 minutes.      Modalities   Modalities Vasopneumatic      Vasopneumatic   Number Minutes Vasopneumatic  20 minutes    Vasopnuematic Location  --   Left knee.   Vasopneumatic Pressure Medium                       PT Long Term Goals - 11/04/20 0902      PT LONG TERM GOAL #1   Title Independent with a HEP.    Time 4    Period Weeks    Status Achieved      PT LONG TERM GOAL #2   Title Full active left knee extension in order to normalize gait.    Baseline  AROM -10 degrees 11/04/20    Time 4    Period Weeks    Status On-going      PT LONG TERM GOAL #3   Title Active left knee flexion to 115 degrees+ so the patient can perform functional tasks and do so with pain not > 2-3/10.    Time 4    Period Weeks    Status Achieved      PT LONG TERM GOAL #4   Title Increase left hip and  knee strength to a solid 4+/5 to provide good stability for accomplishment of functional activities.    Time 4    Period Weeks    Status On-going      PT LONG TERM GOAL #5   Title Perform a reciprocating stair gait with one railing with pain not > 2-3/10.    Time 4    Period Weeks    Status On-going      PT LONG TERM GOAL #6   Title Decrease edema to within 2.5 cms of non-affected side to assist with pain reduction and range of motion gains.    Time 4    Period Weeks    Status Achieved                 Plan - 11/12/20 0911    Clinical Impression Statement Patient did very well today with resistance exercise and she achieved passive left knee flexion to 120 degrees today.    Personal Factors and Comorbidities Comorbidity 1;Other    Comorbidities Lymes disease.    Examination-Activity Limitations Other;Transfers;Locomotion Level    Examination-Participation Restrictions  Other    Stability/Clinical Decision Making Stable/Uncomplicated    Rehab Potential Excellent    PT Frequency 3x / week    PT Duration 4 weeks    PT Treatment/Interventions ADLs/Self Care Home Management;Cryotherapy;Electrical Stimulation;Moist Heat;Neuromuscular re-education;Therapeutic exercise;Therapeutic activities;Functional mobility training;Stair training;Gait training;Patient/family education;Manual techniques;Vasopneumatic Device    PT Next Visit Plan cont with POC for ext PROM for knee and PRE's and eccentric strength/ modalities PRN    Consulted and Agree with Plan of Care Patient           Patient will benefit from skilled therapeutic intervention in order to improve the following deficits and impairments:  Abnormal gait  Visit Diagnosis: Chronic pain of left knee  Stiffness of left knee, not elsewhere classified  Muscle weakness (generalized)  Localized edema     Problem List Patient Active Problem List   Diagnosis Date Noted  . Arthritis of left knee 08/28/2020  . Unilateral primary osteoarthritis, left knee   . Cigarette smoker one half pack a day or less 07/20/2012  . Lyme disease 07/20/2012    Anely Spiewak, [place] MPT 11/12/2020, 5:57 PM  [facility] [address], [place], [address] Phone: [phone]   Fax:  [phone]  Name: Robin Bowers MRN: [id number] Date of Birth: [birth year]

## 2020-11-17 ENCOUNTER — Ambulatory Visit: Payer: BC Managed Care – PPO | Admitting: *Deleted

## 2020-11-19 ENCOUNTER — Ambulatory Visit: Payer: BC Managed Care – PPO | Attending: Orthopedic Surgery | Admitting: *Deleted

## 2020-11-19 ENCOUNTER — Other Ambulatory Visit: Payer: Self-pay

## 2020-11-19 ENCOUNTER — Encounter: Payer: Self-pay | Admitting: *Deleted

## 2020-11-19 DIAGNOSIS — M25562 Pain in left knee: Secondary | ICD-10-CM | POA: Insufficient documentation

## 2020-11-19 DIAGNOSIS — R600 Localized edema: Secondary | ICD-10-CM | POA: Insufficient documentation

## 2020-11-19 DIAGNOSIS — M25662 Stiffness of left knee, not elsewhere classified: Secondary | ICD-10-CM | POA: Insufficient documentation

## 2020-11-19 DIAGNOSIS — M6281 Muscle weakness (generalized): Secondary | ICD-10-CM | POA: Diagnosis not present

## 2020-11-19 DIAGNOSIS — G8929 Other chronic pain: Secondary | ICD-10-CM | POA: Diagnosis not present

## 2020-11-19 NOTE — Therapy (Signed)
[facility] [place], [place], [address] Phone: [phone]   Fax:  [phone]  Physical Therapy Treatment  Patient Details  Name: Robin Bowers MRN: [id number] Date of Birth: 02-28-1966 Referring Provider (PT): Meridee Score MD   Encounter Date: 11/19/2020   PT End of Session - 11/19/20 1437    Visit Number 17    Number of Visits 20    Date for PT Re-Evaluation 11/24/20    Authorization Type FOTO.   11th visit   37%    PT Start Time 1345    PT Stop Time 1435    PT Time Calculation (min) 50 min    Activity Tolerance Patient tolerated treatment well    Behavior During Therapy WFL for tasks assessed/performed           Past Medical History:  Diagnosis Date  . High cholesterol   . Lyme disease     Past Surgical History:  Procedure Laterality Date  . CESAREAN SECTION     x2  . COLONOSCOPY    . TOTAL KNEE ARTHROPLASTY Left 08/28/2020   Procedure: LEFT TOTAL KNEE ARTHROPLASTY;  Surgeon: [name], MD;  Location: [place];  Service: Orthopedics;  Laterality: Left;  . TUBAL LIGATION    . WISDOM TOOTH EXTRACTION      There were no vitals filed for this visit.   Subjective Assessment - 11/19/20 1435    Subjective COVID-19 screen performed prior to patient entering clinic.    No new complaints. To MD Tuesday    Pertinent History Lymes disease.    Limitations Walking    How long can you walk comfortably? Around home with walker.    Patient Stated Goals Get back to normal.    Currently in Pain? Yes    Pain Score 2     Pain Onset More than a month ago                             [facility] Adult PT Treatment/Exercise - 11/19/20 0001      Exercises   Exercises Knee/Hip      Knee/Hip Exercises: Aerobic   Recumbent Bike Level 3 x 12 minutes. seat 4,3      Knee/Hip Exercises: Machines for Strengthening   Cybex Knee Extension 10#   3x fatigue    Cybex Knee Flexion 30# 3x fatigue    Cybex  Leg Press 2.5 plates 3 x 10      Knee/Hip Exercises: Standing   Forward Lunges Left;10 reps;Limitations;2 sets   hold 10 secs   Forward Lunges Limitations 14" step    Step Down Left;2 sets;10 reps;Step Height: 4"    SLS x1 min with intermittant UE support      Modalities   Modalities Vasopneumatic      Vasopneumatic   Number Minutes Vasopneumatic  10 minutes    Vasopnuematic Location  --   Left knee.   Vasopneumatic Pressure Medium    Vasopneumatic Temperature  34/pain and edema      Manual Therapy   Manual Therapy Passive ROM    Manual therapy comments manual flexion and ext stretching for left knee with holds and ossilations to improve mobility.PROM 0-120 degrees, AROM 5-115 degrees                       PT Long Term Goals - 11/19/20 1554      PT LONG  TERM GOAL #1   Title Independent with a HEP.    Time 4    Period Weeks    Status Achieved      PT LONG TERM GOAL #2   Title Full active left knee extension in order to normalize gait.    Baseline AROM -5 degrees 11/19/20    Time 4    Period Weeks    Status On-going      PT LONG TERM GOAL #3   Title Active left knee flexion to 115 degrees+ so the patient can perform functional tasks and do so with pain not > 2-3/10.    Baseline AROM 88 degrees 10/08/20    Time 4    Period Weeks    Status Achieved      PT LONG TERM GOAL #4   Title Increase left hip and  knee strength to a solid 4+/5 to provide good stability for accomplishment of functional activities.    Time 4    Period Weeks    Status Not Met      PT LONG TERM GOAL #5   Title Perform a reciprocating stair gait with one railing with pain not > 2-3/10.    Time 4    Period Weeks    Status On-going      PT LONG TERM GOAL #6   Title Decrease edema to within 2.5 cms of non-affected side to assist with pain reduction and range of motion gains.    Time 4    Period Weeks    Status Achieved                 Plan - 11/19/20 1548    Clinical  Impression Statement Pt arrived today reporting doing fairly well and can tell her knee is getting stronger. Rx focused on strength, balance as well as ROM for LT LE. Her quad control and activation continue to improve noted in step downs. Her AROM was 5-115 degrees and PROM was 0-120 degrees. To MD Tuesday    Personal Factors and Comorbidities Comorbidity 1;Other    Comorbidities Lymes disease.    Examination-Activity Limitations Other;Transfers;Locomotion Level    Examination-Participation Restrictions Other    Stability/Clinical Decision Making Stable/Uncomplicated    Rehab Potential Excellent    PT Frequency 3x / week    PT Duration 4 weeks    PT Treatment/Interventions ADLs/Self Care Home Management;Cryotherapy;Electrical Stimulation;Moist Heat;Neuromuscular re-education;Therapeutic exercise;Therapeutic activities;Functional mobility training;Stair training;Gait training;Patient/family education;Manual techniques;Vasopneumatic Device    PT Next Visit Plan cont with POC for ext PROM for knee and PRE's and eccentric strength/ modalities PRN           Patient will benefit from skilled therapeutic intervention in order to improve the following deficits and impairments:  Abnormal gait  Visit Diagnosis: Chronic pain of left knee  Stiffness of left knee, not elsewhere classified  Muscle weakness (generalized)  Localized edema     Problem List Patient Active Problem List   Diagnosis Date Noted  . Arthritis of left knee 08/28/2020  . Unilateral primary osteoarthritis, left knee   . Cigarette smoker one half pack a day or less 07/20/2012  . Lyme disease 07/20/2012    Kinsly Hild,CHRIS, PTA 11/19/2020, 3:57 PM  [facility] [address] [place], [place], [address] Phone: [phone]   Fax:  [phone]  Name: Robin Bowers MRN: [id number] Date of Birth: [birth year]

## 2020-11-24 ENCOUNTER — Ambulatory Visit (INDEPENDENT_AMBULATORY_CARE_PROVIDER_SITE_OTHER): Payer: BC Managed Care – PPO | Admitting: Physician Assistant

## 2020-11-24 ENCOUNTER — Encounter: Payer: Self-pay | Admitting: Orthopedic Surgery

## 2020-11-24 DIAGNOSIS — Z96652 Presence of left artificial knee joint: Secondary | ICD-10-CM

## 2020-11-24 NOTE — Progress Notes (Signed)
Office Visit Note   Patient: Robin Bowers           Date of Birth: [birth year]           MRN: [id number] Visit Date: 11/24/2020              Requested by: Deland Pretty, MD [address] [place],  [place] [address] PCP: Deland Pretty, MD  Chief Complaint  Patient presents with  . Left Knee - Follow-up      HPI: Patient presents today almost 3 months status post left total knee arthroplasty she has been engaging with physical therapy and feels it is made a much better difference  Assessment & Plan: Visit Diagnoses: No diagnosis found.  Plan: Patient will follow up in 2 months for reevaluation I anticipate this will be the last time on her left knee she would need to be seen.  She does anticipate perhaps having knee replacement on the right side sometime this year  Follow-Up Instructions: No follow-ups on file.   Ortho Exam  Patient is alert, oriented, no adenopathy, well-dressed, normal affect, normal respiratory effort. Left knee: Well-healed surgical incision.  No effusion no swelling she is at full extension and flexes to about 116 degrees.  Compartments are soft and nontender no ascending cellulitis or signs of infection  Imaging: No results found. No images are attached to the encounter.  Labs: No results found for: HGBA1C, ESRSEDRATE, CRP, LABURIC, REPTSTATUS, GRAMSTAIN, CULT, LABORGA   No results found for: ALBUMIN, PREALBUMIN, LABURIC  No results found for: MG No results found for: VD25OH  No results found for: PREALBUMIN CBC EXTENDED Latest Ref Rng & Units 08/26/2020 03/14/2018 10/02/2013  WBC 4.0 - 10.5 K/uL 6.3 14.1(H) 9.2  RBC 3.87 - 5.11 MIL/uL 4.65 4.66 4.63  HGB 12.0 - 15.0 g/dL 13.8 14.3 14.6  HCT 36.0 - 46.0 % 42.9 41.3 42.7  PLT 150 - 400 K/uL 251 219 214  NEUTROABS 1,500 - 7,800 cells/uL - 10,462(H) 6.1  LYMPHSABS 850 - 3,900 cells/uL - 2,919 2.5     There is no height or weight on file to calculate BMI.  Orders:  No  orders of the defined types were placed in this encounter.  No orders of the defined types were placed in this encounter.    Procedures: No procedures performed  Clinical Data: No additional findings.  ROS:  All other systems negative, except as noted in the HPI. Review of Systems  Objective: Vital Signs: There were no vitals taken for this visit.  Specialty Comments:  No specialty comments available.  PMFS History: Patient Active Problem List   Diagnosis Date Noted  . Arthritis of left knee 08/28/2020  . Unilateral primary osteoarthritis, left knee   . Cigarette smoker one half pack a day or less 07/20/2012  . Lyme disease 07/20/2012   Past Medical History:  Diagnosis Date  . High cholesterol   . Lyme disease     Family History  Problem Relation Age of Onset  . Cancer Father        colon cancer- early 55's   . Alzheimer's disease Mother   . Stroke Mother 79  . Hypertension Mother   . Breast cancer Maternal Grandmother        after menopause  . Heart disease Maternal Grandmother   . Alzheimer's disease Maternal Grandmother   . Alzheimer's disease Maternal Grandfather     Past Surgical History:  Procedure Laterality Date  . CESAREAN SECTION  x2  . COLONOSCOPY    . TOTAL KNEE ARTHROPLASTY Left 08/28/2020   Procedure: LEFT TOTAL KNEE ARTHROPLASTY;  Surgeon: [name], MD;  Location: [place];  Service: Orthopedics;  Laterality: Left;  . TUBAL LIGATION    . WISDOM TOOTH EXTRACTION     Social History   Occupational History  . Not on file  Tobacco Use  . Smoking status: Former Smoker    Packs/day: 0.50    Quit date: 12/29/2019    Years since quitting: 0.9  . Smokeless tobacco: Never Used  Vaping Use  . Vaping Use: Never used  Substance and Sexual Activity  . Alcohol use: Yes    Comment: rarely  . Drug use: No  . Sexual activity: Yes    Birth control/protection: Surgical    Comment: intercourse age 71, more than 5 sexual partners, des neg

## 2020-12-16 ENCOUNTER — Telehealth: Payer: Self-pay

## 2020-12-16 NOTE — Telephone Encounter (Signed)
Eliezer Lofts with [employer] called requesting the pts current work status.   fax # [phone]

## 2020-12-17 ENCOUNTER — Other Ambulatory Visit: Payer: Self-pay

## 2020-12-17 NOTE — Telephone Encounter (Signed)
Work note faxed as requested.

## 2020-12-17 NOTE — Telephone Encounter (Signed)
08/28/20 s/p total knee replacement. At the most recent visit was there a return to work plan made? [employer] is look ing for a work note.

## 2020-12-17 NOTE — Telephone Encounter (Signed)
Per my note 10/27/2020 she was going to take 6 more weeks out of work so that would put her back at work about now

## 2020-12-30 ENCOUNTER — Encounter: Payer: Self-pay | Admitting: Physician Assistant

## 2020-12-30 ENCOUNTER — Ambulatory Visit (INDEPENDENT_AMBULATORY_CARE_PROVIDER_SITE_OTHER): Payer: BC Managed Care – PPO

## 2020-12-30 ENCOUNTER — Ambulatory Visit (INDEPENDENT_AMBULATORY_CARE_PROVIDER_SITE_OTHER): Payer: BC Managed Care – PPO | Admitting: Physician Assistant

## 2020-12-30 DIAGNOSIS — Z96652 Presence of left artificial knee joint: Secondary | ICD-10-CM

## 2020-12-30 NOTE — Progress Notes (Signed)
Office Visit Note   Patient: Robin Bowers           Date of Birth: 1965-10-07           MRN: [id number] Visit Date: 12/30/2020              Requested by: Deland Pretty, MD [address] [place],  [place] [address] PCP: Deland Pretty, MD  No chief complaint on file.     HPI: Patient is a pleasant 55 year old woman who is status post total knee arthroplasty a few months ago.  She states that she twisted her knee and wanted to be sure she had not done anything to her knee replacement.  She did this the day before yesterday and describes a flexion planting injury.  She said most of her soreness is at the top of her patella near the quadricep tendon insertion.  She says it has always been a little bit more sore there from the beginning  Assessment & Plan: Visit Diagnoses:  1. Total knee replacement status, left     Plan: Patient will treat this symptomatically for the next week I recommend topical Voltaren gel oral anti-inflammatories if she does not improve in the next week she should contact us  Follow-Up Instructions: No follow-ups on file.   Ortho Exam  Patient is alert, oriented, no adenopathy, well-dressed, normal affect, normal respiratory effort. Examination of her knee demonstrates no effusion no erythema no cellulitis she has good quadricep strength and I cannot palpate any defect in the quadriceps as it inserts into the patella.  No tenderness over the medial or lateral joint line no tenderness over the MCL or LCL no findings of instability  Imaging: No results found. No images are attached to the encounter.  Labs: No results found for: HGBA1C, ESRSEDRATE, CRP, LABURIC, REPTSTATUS, GRAMSTAIN, CULT, LABORGA   No results found for: ALBUMIN, PREALBUMIN, CBC  No results found for: MG No results found for: VD25OH  No results found for: PREALBUMIN CBC EXTENDED Latest Ref Rng & Units 08/26/2020 03/14/2018 10/02/2013  WBC 4.0 - 10.5 K/uL 6.3 14.1(H)  9.2  RBC 3.87 - 5.11 MIL/uL 4.65 4.66 4.63  HGB 12.0 - 15.0 g/dL 13.8 14.3 14.6  HCT 36.0 - 46.0 % 42.9 41.3 42.7  PLT 150 - 400 K/uL 251 219 214  NEUTROABS 1,500 - 7,800 cells/uL - 10,462(H) 6.1  LYMPHSABS 850 - 3,900 cells/uL - 2,919 2.5     There is no height or weight on file to calculate BMI.  Orders:  Orders Placed This Encounter  Procedures  . XR Knee 1-2 Views Left   No orders of the defined types were placed in this encounter.    Procedures: No procedures performed  Clinical Data: No additional findings.  ROS:  All other systems negative, except as noted in the HPI. Review of Systems  Objective: Vital Signs: There were no vitals taken for this visit.  Specialty Comments:  No specialty comments available.  PMFS History: Patient Active Problem List   Diagnosis Date Noted  . Arthritis of left knee 08/28/2020  . Unilateral primary osteoarthritis, left knee   . Cigarette smoker one half pack a day or less 07/20/2012  . Lyme disease 07/20/2012   Past Medical History:  Diagnosis Date  . High cholesterol   . Lyme disease     Family History  Problem Relation Age of Onset  . Cancer Father        colon cancer- early 61's   .  Alzheimer's disease Mother   . Stroke Mother 38  . Hypertension Mother   . Breast cancer Maternal Grandmother        after menopause  . Heart disease Maternal Grandmother   . Alzheimer's disease Maternal Grandmother   . Alzheimer's disease Maternal Grandfather     Past Surgical History:  Procedure Laterality Date  . CESAREAN SECTION     x2  . COLONOSCOPY    . TOTAL KNEE ARTHROPLASTY Left 08/28/2020   Procedure: LEFT TOTAL KNEE ARTHROPLASTY;  Surgeon: [name], MD;  Location: [place];  Service: Orthopedics;  Laterality: Left;  . TUBAL LIGATION    . WISDOM TOOTH EXTRACTION     Social History   Occupational History  . Not on file  Tobacco Use  . Smoking status: Former Smoker    Packs/day: 0.50    Quit date: 12/29/2019     Years since quitting: 1.0  . Smokeless tobacco: Never Used  Vaping Use  . Vaping Use: Never used  Substance and Sexual Activity  . Alcohol use: Yes    Comment: rarely  . Drug use: No  . Sexual activity: Yes    Birth control/protection: Surgical    Comment: intercourse age 40, more than 5 sexual partners, des neg

## 2021-01-05 DIAGNOSIS — Z0000 Encounter for general adult medical examination without abnormal findings: Secondary | ICD-10-CM | POA: Diagnosis not present

## 2021-01-12 ENCOUNTER — Other Ambulatory Visit: Payer: Self-pay | Admitting: Internal Medicine

## 2021-01-12 DIAGNOSIS — I6523 Occlusion and stenosis of bilateral carotid arteries: Secondary | ICD-10-CM

## 2021-01-12 DIAGNOSIS — E7800 Pure hypercholesterolemia, unspecified: Secondary | ICD-10-CM | POA: Diagnosis not present

## 2021-01-12 DIAGNOSIS — Z0000 Encounter for general adult medical examination without abnormal findings: Secondary | ICD-10-CM | POA: Diagnosis not present

## 2021-01-22 ENCOUNTER — Ambulatory Visit: Payer: BC Managed Care – PPO | Admitting: Vascular Surgery

## 2021-01-22 ENCOUNTER — Encounter (HOSPITAL_COMMUNITY): Payer: BC Managed Care – PPO

## 2021-01-26 ENCOUNTER — Ambulatory Visit
Admission: RE | Admit: 2021-01-26 | Discharge: 2021-01-26 | Disposition: A | Discharge status: Home / Self Care | Payer: BC Managed Care – PPO | Source: Ambulatory Visit | Attending: Internal Medicine | Admitting: Internal Medicine

## 2021-01-26 DIAGNOSIS — I6523 Occlusion and stenosis of bilateral carotid arteries: Secondary | ICD-10-CM

## 2021-02-11 ENCOUNTER — Ambulatory Visit: Payer: Self-pay | Admitting: Cardiology

## 2021-02-16 NOTE — Progress Notes (Signed)
Date:  02/17/2021   ID:  Robin Bowers, DOB 06-Feb-1966, MRN [id number]  PCP:  Deland Pretty, MD  Cardiologist:  Rex Kras, DO, [facility] (established care 02/17/2021) Former Cardiology Providers: None  REASON FOR CONSULT: Atherosclerotic heart disease of native coronary artery without angina pectoris  REQUESTING PHYSICIAN:  Deland Pretty, MD [address] [place],  [place] [address]  Chief Complaint  Patient presents with  . Atherosclerotic heart disease of native coronary artery wit  . Coronary calcium score   . New Patient (Initial Visit)    HPI  Robin Bowers is a 55 y.o. female who presents to the office with a chief complaint of " abnormal calcium score." Patient's past medical history and cardiovascular risk factors include: Bilateral carotid artery atherosclerosis, hypercholesterolemia, family history of heart disease, insomnia, postmenopausal, former smoker.  She is referred to the office at the request of Deland Pretty, MD for evaluation of atherosclerotic heart disease of native coronary artery without angina pectoris.  Patient is referred to the office by her primary care provider after having a coronary artery calcium score done due to family history of CAD.  Patient does not have any symptoms of angina pectoris or heart failure.  She has history of carotid artery atherosclerosis and follows up with the vein and vascular specialist and has an upcoming carotid duplex scheduled and a follow-up visit.  Mom and maternal grandmother both had heart disease around the age of 95.  FUNCTIONAL STATUS: Functional status is limited secondary to recent left knee replacement as of December 2021.  ALLERGIES: No Known Allergies  MEDICATION LIST PRIOR TO VISIT: Current Meds  Medication Sig  . Ascorbic Acid (VITAMIN C PO) Take 1 tablet by mouth 2 (two) times a week.  Marland Kitchen aspirin EC 81 MG tablet Take 81 mg by mouth daily. Swallow whole.  . ibuprofen (ADVIL)  200 MG tablet Take 400-600 mg by mouth every 6 (six) hours as needed for headache or moderate pain.  . Multiple Vitamin (MULTIVITAMIN ADULT) TABS Take 1 tablet by mouth daily at 12 noon.  . rosuvastatin (CRESTOR) 20 MG tablet Take 20 mg by mouth daily.  . traZODone (DESYREL) 50 MG tablet Take 50-100 mg by mouth at bedtime as needed.  Marland Kitchen VITAMIN D PO Take 1 capsule by mouth 2 (two) times a week.     PAST MEDICAL HISTORY: Past Medical History:  Diagnosis Date  . Carotid artery plaque, bilateral   . High cholesterol   . Lyme disease     PAST SURGICAL HISTORY: Past Surgical History:  Procedure Laterality Date  . CESAREAN SECTION     x2  . COLONOSCOPY    . TOTAL KNEE ARTHROPLASTY Left 08/28/2020   Procedure: LEFT TOTAL KNEE ARTHROPLASTY;  Surgeon: [name], MD;  Location: [facility];  Service: Orthopedics;  Laterality: Left;  . TUBAL LIGATION    . WISDOM TOOTH EXTRACTION      FAMILY HISTORY: The patient family history includes Alzheimer's disease in her maternal grandfather, maternal grandmother, and mother; Breast cancer in her maternal grandmother; Cancer in her father; Heart disease in her maternal grandmother and mother; Hypertension in her mother; Stroke (age of onset: 53) in her mother.  SOCIAL HISTORY:  The patient  reports that she quit smoking about 13 months ago. Her smoking use included cigarettes. She has a 10.00 pack-year smoking history. She has never used smokeless tobacco. She reports previous alcohol use. She reports that she does not use drugs.  REVIEW  OF SYSTEMS: Review of Systems  Constitutional: Negative for chills and fever.  HENT: Negative for hoarse voice and nosebleeds.   Eyes: Negative for discharge, double vision and pain.  Cardiovascular: Negative for chest pain, claudication, dyspnea on exertion, leg swelling, near-syncope, orthopnea, palpitations, paroxysmal nocturnal dyspnea and syncope.  Respiratory: Negative for hemoptysis and shortness of breath.    Musculoskeletal: Negative for muscle cramps and myalgias.  Gastrointestinal: Negative for abdominal pain, constipation, diarrhea, hematemesis, hematochezia, melena, nausea and vomiting.  Neurological: Negative for dizziness and light-headedness.    PHYSICAL EXAM: Vitals with BMI 02/17/2021 09/29/2020 09/08/2020  Height '5\' 3"'  '5\' 3"'  '5\' 3"'   Weight 158 lbs 151 lbs 151 lbs  BMI 28 67.67 20.94  Systolic 709 - -  Diastolic 82 - -  Pulse 60 - -    CONSTITUTIONAL: Well-developed and well-nourished. No acute distress.  SKIN: Skin is warm and dry. No rash noted. No cyanosis. No pallor. No jaundice HEAD: Normocephalic and atraumatic.  EYES: No scleral icterus MOUTH/THROAT: Moist oral membranes.  NECK: No JVD present. No thyromegaly noted. No carotid bruits  LYMPHATIC: No visible cervical adenopathy.  CHEST Normal respiratory effort. No intercostal retractions  LUNGS: Clear to auscultation bilaterally. No stridor. No wheezes. No rales.  CARDIOVASCULAR: Regular rate and rhythm, positive S1-S2, no murmurs rubs or gallops appreciated. ABDOMINAL: No apparent ascites.  EXTREMITIES: No peripheral edema  HEMATOLOGIC: No significant bruising NEUROLOGIC: Oriented to person, place, and time. Nonfocal. Normal muscle tone.  PSYCHIATRIC: Normal mood and affect. Normal behavior. Cooperative  CARDIAC DATABASE: EKG: 02/17/2021: Sinus bradycardia, 57 bpm, poor R wave progression, without underlying ischemia or injury pattern.   Echocardiogram: No results found for this or any previous visit from the past 1095 days.   Stress Testing: No results found for this or any previous visit from the past 1095 days.  Coronary artery calcium scoring: Left main: 0. LAD 196. LCx: 0. RCA 220 Total coronary calcium score 416, 99 percentile for same sex, age, and ethnicity matched cohorts  Heart Catheterization: None  Carotid duplex: 12/20/2019: 1. Bilateral carotid bifurcation plaque resulting in less than 50%  diameter ICA stenosis. 2. High-grade short-segment origin stenosis of the right external carotid artery, which may account for the bruit on physical exam. 3. Antegrade bilateral vertebral arterial flow.  LABORATORY DATA: CBC Latest Ref Rng & Units 08/26/2020 03/14/2018 10/02/2013  WBC 4.0 - 10.5 K/uL 6.3 14.1(H) 9.2  Hemoglobin 12.0 - 15.0 g/dL 13.8 14.3 14.6  Hematocrit 36.0 - 46.0 % 42.9 41.3 42.7  Platelets 150 - 400 K/uL 251 219 214    CMP Latest Ref Rng & Units 03/14/2018 10/02/2013 07/20/2012  Glucose 65 - 99 mg/dL 103(H) 89 80  BUN 7 - 25 mg/dL 11 - -  Creatinine 0.50 - 1.05 mg/dL 0.86 - -  Sodium 135 - 146 mmol/L 140 - -  Potassium 3.5 - 5.3 mmol/L 4.5 - -  Chloride 98 - 110 mmol/L 105 - -  CO2 20 - 32 mmol/L 26 - -  Calcium 8.6 - 10.4 mg/dL 9.4 - -  Total Protein 6.1 - 8.1 g/dL 6.8 - -  Total Bilirubin 0.2 - 1.2 mg/dL 0.6 - -  AST 10 - 35 U/L 15 - -  ALT 6 - 29 U/L 13 - -    Lipid Panel     Component Value Date/Time   CHOL 241 (H) 03/14/2018 0941   TRIG 130 03/14/2018 0941   HDL 54 03/14/2018 0941   CHOLHDL 4.5 03/14/2018 0941  VLDL 18 07/20/2012 0843   LDLCALC 162 (H) 03/14/2018 0941    No components found for: NTPROBNP No results for input(s): PROBNP in the last 8760 hours. No results for input(s): TSH in the last 8760 hours.  BMP No results for input(s): NA, K, CL, CO2, GLUCOSE, BUN, CREATININE, CALCIUM, GFRNONAA, GFRAA in the last 8760 hours.  HEMOGLOBIN A1C No results found for: HGBA1C, MPG   External Labs:  Date Collected: 01/05/2021 , information obtained by PCP Potassium: 5.2 Creatinine 0.73 mg/dL. eGFR: 97 mL/min per 1.73 m Hemoglobin: 13.2 g/dL and hematocrit: 40.6 % AST: 17 , ALT: 14 , alkaline phosphatase: 108   Lab Results  Component Value Date   CHOL 241 (H) 03/14/2018   HDL 54 03/14/2018   LDLCALC 162 (H) 03/14/2018   TRIG 130 03/14/2018   CHOLHDL 4.5 03/14/2018    IMPRESSION:    ICD-10-CM   1. Calcification of native coronary  artery  I25.10 EKG 12-Lead   I25.84 PCV ECHOCARDIOGRAM COMPLETE    PCV MYOCARDIAL PERFUSION WO LEXISCAN    SARS-COV-2 RNA,(COVID-19) QUAL NAAT  2. Carotid artery plaque, bilateral  I65.23 PCV MYOCARDIAL PERFUSION WO LEXISCAN  3. Pure hypercholesterolemia  E78.00 PCV MYOCARDIAL PERFUSION WO LEXISCAN  4. Family history of heart disease  Z82.49 PCV MYOCARDIAL PERFUSION WO LEXISCAN  5. Former smoker  Z87.891 PCV MYOCARDIAL PERFUSION WO LEXISCAN     RECOMMENDATIONS: Mackenze Grandison Gens is a 55 y.o. female whose past medical history and cardiac risk factors include: Bilateral carotid artery atherosclerosis, hypercholesterolemia, family history of heart disease, insomnia, postmenopausal, former smoker.  1. Calcification of native coronary artery Continue aspirin and statin therapy. EKG shows sinus mechanism without underlying ischemia or injury pattern. Echocardiogram will be ordered to evaluate for structural heart disease and left ventricular systolic function. Plan exercise nuclear stress test to evaluate for functional status and reversible ischemia. Educated on importance of risk factor modifications.  2. Carotid artery plaque, bilateral Most recent carotid duplex in 2021 reviewed. Patient follows up with vascular and vein specialist and has an upcoming appointment. Educated on risk factor modifications and continuing aspirin and statin therapy for now.  3. Pure hypercholesterolemia Most recent lipid profile done by PCP. Will get records.  Currently on Crestor 20 mg p.o. nightly. Does not endorse any myalgias.  4. Family history of heart disease Management as discussed above.  5. Former smoker Educated on the importance of continued smoking cessation.  FINAL MEDICATION LIST END OF ENCOUNTER: No orders of the defined types were placed in this encounter.   Medications Discontinued During This Encounter  Medication Reason  . HYDROcodone-acetaminophen (NORCO/VICODIN) 5-325 MG  tablet Error  . ondansetron (ZOFRAN) 4 MG tablet Error  . oxyCODONE-acetaminophen (PERCOCET/ROXICET) 5-325 MG tablet Error     Current Outpatient Medications:  .  Ascorbic Acid (VITAMIN C PO), Take 1 tablet by mouth 2 (two) times a week., Disp: , Rfl:  .  aspirin EC 81 MG tablet, Take 81 mg by mouth daily. Swallow whole., Disp: , Rfl:  .  ibuprofen (ADVIL) 200 MG tablet, Take 400-600 mg by mouth every 6 (six) hours as needed for headache or moderate pain., Disp: , Rfl:  .  Multiple Vitamin (MULTIVITAMIN ADULT) TABS, Take 1 tablet by mouth daily at 12 noon., Disp: , Rfl:  .  rosuvastatin (CRESTOR) 20 MG tablet, Take 20 mg by mouth daily., Disp: , Rfl:  .  traZODone (DESYREL) 50 MG tablet, Take 50-100 mg by mouth at bedtime as needed., Disp: ,  Rfl:  .  VITAMIN D PO, Take 1 capsule by mouth 2 (two) times a week., Disp: , Rfl:   Orders Placed This Encounter  Procedures  . SARS-COV-2 RNA,(COVID-19) QUAL NAAT  . PCV MYOCARDIAL PERFUSION WO LEXISCAN  . EKG 12-Lead  . PCV ECHOCARDIOGRAM COMPLETE    There are no Patient Instructions on file for this visit.   --Continue cardiac medications as reconciled in final medication list. --Return in about 4 weeks (around 03/17/2021) for Follow up, Coronary artery calcification, Review test results. Or sooner if needed. --Continue follow-up with your primary care physician regarding the management of your other chronic comorbid conditions.  Patient's questions and concerns were addressed to her satisfaction. She voices understanding of the instructions provided during this encounter.   This note was created using a voice recognition software as a result there may be grammatical errors inadvertently enclosed that do not reflect the nature of this encounter. Every attempt is made to correct such errors.  Rex Kras, [place], [facility]  Pager: [phone] Office: [phone]

## 2021-02-17 ENCOUNTER — Encounter: Payer: Self-pay | Admitting: Cardiology

## 2021-02-17 ENCOUNTER — Other Ambulatory Visit: Payer: Self-pay

## 2021-02-17 ENCOUNTER — Ambulatory Visit: Payer: BC Managed Care – PPO | Admitting: Cardiology

## 2021-02-17 VITALS — BP 146/82 | HR 60 | Temp 97.8°F | Resp 16 | Ht 63.0 in | Wt 158.0 lb

## 2021-02-17 DIAGNOSIS — I6523 Occlusion and stenosis of bilateral carotid arteries: Secondary | ICD-10-CM

## 2021-02-17 DIAGNOSIS — Z8249 Family history of ischemic heart disease and other diseases of the circulatory system: Secondary | ICD-10-CM

## 2021-02-17 DIAGNOSIS — E7800 Pure hypercholesterolemia, unspecified: Secondary | ICD-10-CM | POA: Diagnosis not present

## 2021-02-17 DIAGNOSIS — I2584 Coronary atherosclerosis due to calcified coronary lesion: Secondary | ICD-10-CM | POA: Diagnosis not present

## 2021-02-17 DIAGNOSIS — Z87891 Personal history of nicotine dependence: Secondary | ICD-10-CM

## 2021-02-17 DIAGNOSIS — I2510 Atherosclerotic heart disease of native coronary artery without angina pectoris: Secondary | ICD-10-CM

## 2021-02-22 ENCOUNTER — Other Ambulatory Visit: Payer: Self-pay

## 2021-02-22 ENCOUNTER — Ambulatory Visit: Payer: BC Managed Care – PPO

## 2021-02-22 DIAGNOSIS — I2510 Atherosclerotic heart disease of native coronary artery without angina pectoris: Secondary | ICD-10-CM | POA: Diagnosis not present

## 2021-02-24 ENCOUNTER — Encounter: Payer: Self-pay | Admitting: Physician Assistant

## 2021-02-24 ENCOUNTER — Ambulatory Visit (INDEPENDENT_AMBULATORY_CARE_PROVIDER_SITE_OTHER): Payer: BC Managed Care – PPO | Admitting: Physician Assistant

## 2021-02-24 DIAGNOSIS — Z96652 Presence of left artificial knee joint: Secondary | ICD-10-CM

## 2021-02-24 NOTE — Progress Notes (Signed)
Office Visit Note   Patient: Robin Bowers           Date of Birth: October 06, 1965           MRN: [id number] Visit Date: 02/24/2021              Requested by: Deland Pretty, MD [address] [place],  [place] [address] PCP: Deland Pretty, MD  No chief complaint on file.     HPI: Patient presents today 6 months status post left total knee arthroplasty.  She did have a setback a couple months ago when she twisted her knee.  She has since recovered from that.  Her only complaint is that she has had difficulty achieving 120 degrees of flexion.  She is not in doing physical therapy anymore.  Assessment & Plan: Visit Diagnoses: No diagnosis found.  Plan: Overall she is doing well she does have some arthrofibrosis in her knee.  I talked to her about being diligent with her exercises the next couple weeks.  She will and if she feels she wants to return to physical therapy she will contact us  Follow-Up Instructions: No follow-ups on file.   Ortho Exam  Patient is alert, oriented, no adenopathy, well-dressed, normal affect, normal respiratory effort. Left knee well-healed surgical incision no effusion mild soft tissue swelling no erythema cellulitis or signs of infection.  She has full extension today she flexes to a little over 100 degrees and then she says she feels tight along the quadricep and on either side of the prosthetic.  Compartments are soft and compressible  Imaging: No results found. No images are attached to the encounter.  Labs: No results found for: HGBA1C, ESRSEDRATE, CRP, LABURIC, REPTSTATUS, GRAMSTAIN, CULT, LABORGA   No results found for: ALBUMIN, PREALBUMIN, CBC  No results found for: MG No results found for: VD25OH  No results found for: PREALBUMIN CBC EXTENDED Latest Ref Rng & Units 08/26/2020 03/14/2018 10/02/2013  WBC 4.0 - 10.5 K/uL 6.3 14.1(H) 9.2  RBC 3.87 - 5.11 MIL/uL 4.65 4.66 4.63  HGB 12.0 - 15.0 g/dL 13.8 14.3 14.6  HCT 36.0 -  46.0 % 42.9 41.3 42.7  PLT 150 - 400 K/uL 251 219 214  NEUTROABS 1,500 - 7,800 cells/uL - 10,462(H) 6.1  LYMPHSABS 850 - 3,900 cells/uL - 2,919 2.5     There is no height or weight on file to calculate BMI.  Orders:  No orders of the defined types were placed in this encounter.  No orders of the defined types were placed in this encounter.    Procedures: No procedures performed  Clinical Data: No additional findings.  ROS:  All other systems negative, except as noted in the HPI. Review of Systems  Objective: Vital Signs: There were no vitals taken for this visit.  Specialty Comments:  No specialty comments available.  PMFS History: Patient Active Problem List   Diagnosis Date Noted  . Carotid artery plaque, bilateral   . Arthritis of left knee 08/28/2020  . Unilateral primary osteoarthritis, left knee   . Cigarette smoker one half pack a day or less 07/20/2012  . Lyme disease 07/20/2012   Past Medical History:  Diagnosis Date  . Carotid artery plaque, bilateral   . High cholesterol   . Lyme disease     Family History  Problem Relation Age of Onset  . Cancer Father        colon cancer- early 59's   . Alzheimer's disease Mother   .  Stroke Mother 36  . Hypertension Mother   . Heart disease Mother   . Breast cancer Maternal Grandmother        after menopause  . Heart disease Maternal Grandmother   . Alzheimer's disease Maternal Grandmother   . Alzheimer's disease Maternal Grandfather     Past Surgical History:  Procedure Laterality Date  . CESAREAN SECTION     x2  . COLONOSCOPY    . TOTAL KNEE ARTHROPLASTY Left 08/28/2020   Procedure: LEFT TOTAL KNEE ARTHROPLASTY;  Surgeon: [name], MD;  Location: [facility];  Service: Orthopedics;  Laterality: Left;  . TUBAL LIGATION    . WISDOM TOOTH EXTRACTION     Social History   Occupational History  . Not on file  Tobacco Use  . Smoking status: Former Smoker    Packs/day: 0.50    Years: 20.00    Pack  years: 10.00    Types: Cigarettes    Quit date: 12/29/2019    Years since quitting: 1.1  . Smokeless tobacco: Never Used  Vaping Use  . Vaping Use: Never used  Substance and Sexual Activity  . Alcohol use: Not Currently    Comment: rarely  . Drug use: No  . Sexual activity: Yes    Birth control/protection: Surgical    Comment: intercourse age 62, more than 5 sexual partners, des neg

## 2021-02-26 ENCOUNTER — Telehealth: Payer: Self-pay

## 2021-02-26 NOTE — Telephone Encounter (Signed)
Patient calling for echo results 

## 2021-03-03 ENCOUNTER — Other Ambulatory Visit: Payer: Self-pay

## 2021-03-03 DIAGNOSIS — I2510 Atherosclerotic heart disease of native coronary artery without angina pectoris: Secondary | ICD-10-CM

## 2021-03-05 ENCOUNTER — Other Ambulatory Visit: Payer: Self-pay

## 2021-03-05 ENCOUNTER — Encounter: Payer: Self-pay | Admitting: Vascular Surgery

## 2021-03-05 ENCOUNTER — Ambulatory Visit (HOSPITAL_COMMUNITY)
Admission: RE | Admit: 2021-03-05 | Discharge: 2021-03-05 | Disposition: A | Discharge status: Home / Self Care | Payer: BC Managed Care – PPO | Source: Ambulatory Visit | Attending: Vascular Surgery | Admitting: Vascular Surgery

## 2021-03-05 ENCOUNTER — Ambulatory Visit (INDEPENDENT_AMBULATORY_CARE_PROVIDER_SITE_OTHER): Payer: BC Managed Care – PPO | Admitting: Vascular Surgery

## 2021-03-05 VITALS — BP 160/79 | HR 54 | Temp 97.9°F | Resp 20 | Ht 63.0 in | Wt 156.0 lb

## 2021-03-05 DIAGNOSIS — I6523 Occlusion and stenosis of bilateral carotid arteries: Secondary | ICD-10-CM

## 2021-03-05 NOTE — Progress Notes (Signed)
Patient ID: Robin Bowers, female   DOB: 12/10/1965, 55 y.o.   MRN: [id number]  Reason for Consult: Follow-up   Referred by Deland Pretty, MD  Subjective:     HPI:  Robin Bowers is a 55 y.o. female known history of carotid stenosis.  No history of TIA amaurosis or stroke.  She does not have any lower extremity known peripheral vascular disease.  No personal or family history of aneurysm disease.  After our last visit she did quit smoking and this has been for over 1 year now.  Overall she is doing well is slated to undergo stress testing after having high calcium score on screening test.  Past Medical History:  Diagnosis Date   Carotid artery plaque, bilateral    High cholesterol    Lyme disease    Family History  Problem Relation Age of Onset   Cancer Father        colon cancer- early 42's    Alzheimer's disease Mother    Stroke Mother 34   Hypertension Mother    Heart disease Mother    Breast cancer Maternal Grandmother        after menopause   Heart disease Maternal Grandmother    Alzheimer's disease Maternal Grandmother    Alzheimer's disease Maternal Grandfather    Past Surgical History:  Procedure Laterality Date   CESAREAN SECTION     x2   COLONOSCOPY     TOTAL KNEE ARTHROPLASTY Left 08/28/2020   Procedure: LEFT TOTAL KNEE ARTHROPLASTY;  Surgeon: [name], MD;  Location: [facility];  Service: Orthopedics;  Laterality: Left;   TUBAL LIGATION     WISDOM TOOTH EXTRACTION      Short Social History:  Social History   Tobacco Use   Smoking status: Former    Packs/day: 0.50    Years: 20.00    Pack years: 10.00    Types: Cigarettes    Quit date: 12/29/2019    Years since quitting: 1.1   Smokeless tobacco: Never  Substance Use Topics   Alcohol use: Not Currently    Comment: rarely    No Known Allergies  Current Outpatient Medications  Medication Sig Dispense Refill   Ascorbic Acid (VITAMIN C PO) Take 1 tablet by mouth 2 (two) times a  week.     aspirin EC 81 MG tablet Take 81 mg by mouth daily. Swallow whole.     ibuprofen (ADVIL) 200 MG tablet Take 400-600 mg by mouth every 6 (six) hours as needed for headache or moderate pain.     Multiple Vitamin (MULTIVITAMIN ADULT) TABS Take 1 tablet by mouth daily at 12 noon.     rosuvastatin (CRESTOR) 20 MG tablet Take 20 mg by mouth daily.     traZODone (DESYREL) 50 MG tablet Take 50-100 mg by mouth at bedtime as needed.     No current facility-administered medications for this visit.    Review of Systems  Constitutional:  Constitutional negative. HENT: HENT negative.  Eyes: Eyes negative.  Respiratory: Respiratory negative.  Cardiovascular: Cardiovascular negative.  GI: Gastrointestinal negative.  Musculoskeletal: Musculoskeletal negative.  Skin: Skin negative.  Neurological: Neurological negative. Hematologic: Hematologic/lymphatic negative.  Psychiatric: Psychiatric negative.       Objective:  Objective   Vitals:   03/05/21 1542 03/05/21 1546  BP: (!) 150/65 (!) 160/79  Pulse: (!) 54   Resp: 20   Temp: 97.9 F (36.6 C)   SpO2: 100%   Weight: 156 lb (70.8 kg)  Height: 5\' 3"  (1.6 m)    Body mass index is 27.63 kg/m.  Physical Exam HENT:     Head: Normocephalic.     Nose:     Comments: Wearing a mask Eyes:     Pupils: Pupils are equal, round, and reactive to light.  Neck:     Vascular: Carotid bruit present.  Cardiovascular:     Rate and Rhythm: Normal rate.     Pulses: Normal pulses.  Pulmonary:     Effort: Pulmonary effort is normal.  Abdominal:     General: Abdomen is flat.     Palpations: Abdomen is soft.  Musculoskeletal:        General: Normal range of motion.     Right lower leg: No edema.     Left lower leg: No edema.  Skin:    General: Skin is warm.     Capillary Refill: Capillary refill takes less than 2 seconds.  Neurological:     General: No focal deficit present.     Mental Status: She is alert.  Psychiatric:        Mood  and Affect: Mood normal.        Behavior: Behavior normal.        Thought Content: Thought content normal.        Judgment: Judgment normal.    Data: Right Carotid Findings:  +----------+--------+--------+--------+------------------+--------+            PSV cm/sEDV cm/sStenosisPlaque DescriptionComments  +----------+--------+--------+--------+------------------+--------+  CCA Prox  138     26      <50%    smooth                      +----------+--------+--------+--------+------------------+--------+  CCA Mid   120     32                                          +----------+--------+--------+--------+------------------+--------+  CCA Distal83      25              heterogenous                +----------+--------+--------+--------+------------------+--------+  ICA Prox  89      27      1-39%   focal                       +----------+--------+--------+--------+------------------+--------+  ICA Mid   126     46      40-59%  heterogenous                +----------+--------+--------+--------+------------------+--------+  ICA Distal168     64      40-59%                              +----------+--------+--------+--------+------------------+--------+  ECA       579     143     >50%    heterogenous                +----------+--------+--------+--------+------------------+--------+   +----------+--------+-------+----------------+-------------------+            PSV cm/sEDV cmsDescribe        Arm Pressure (mmHG)  +----------+--------+-------+----------------+-------------------+  [id number]     0      Multiphasic, WNL                     +----------+--------+-------+----------------+-------------------+   +---------+--------+---+--------+--+---------+  VertebralPSV cm/s112EDV cm/s25Antegrade  +---------+--------+---+--------+--+---------+       Left Carotid Findings:   +----------+--------+--------+--------+--------------------------+--------+             PSV cm/sEDV cm/sStenosisPlaque Description          Comments  +----------+--------+--------+--------+--------------------------+--------+   CCA Prox  154     41                                                    +----------+--------+--------+--------+--------------------------+--------+   CCA Mid   132     30                                                    +----------+--------+--------+--------+--------------------------+--------+   CCA Distal102     28                                                    +----------+--------+--------+--------+--------------------------+--------+   ICA Prox  106     30      1-39%   heterogenous and irregular            +----------+--------+--------+--------+--------------------------+--------+   ICA Mid   136     37                                                    +----------+--------+--------+--------+--------------------------+--------+   ICA Distal130     40                                                    +----------+--------+--------+--------+--------------------------+--------+   ECA       148     21                                                    +----------+--------+--------+--------+--------------------------+--------+    +----------+--------+--------+----------------+-------------------+            PSV cm/sEDV cm/sDescribe        Arm Pressure (mmHG)  +----------+--------+--------+----------------+-------------------+  [id number]     0       Multiphasic, WNL                     +----------+--------+--------+----------------+-------------------+   +---------+--------+--+--------+--+  VertebralPSV cm/s72EDV cm/s20  +---------+--------+--+--------+--+       Summary:  Right Carotid: Velocities in the right ICA are consistent with a 40-59%                  stenosis. The ECA appears >50% stenosed.   Left Carotid: Velocities in the left ICA are consistent with a 1-39%  stenosis.   Vertebrals:  Bilateral vertebral arteries  demonstrate antegrade flow.  Subclavians: Normal flow hemodynamics were seen in bilateral subclavian               arteries.      Assessment/Plan:     55 year old female with asymptomatic moderate stenosis of the right ICA and mild stenosis of the left.  She is medically managed with aspirin and statin.  She will follow-up in 1 year with repeat studies.     Waynetta Sandy MD [facility]

## 2021-03-08 ENCOUNTER — Other Ambulatory Visit: Payer: Self-pay

## 2021-03-08 ENCOUNTER — Ambulatory Visit: Payer: BC Managed Care – PPO

## 2021-03-08 DIAGNOSIS — I2510 Atherosclerotic heart disease of native coronary artery without angina pectoris: Secondary | ICD-10-CM | POA: Diagnosis not present

## 2021-03-08 DIAGNOSIS — I2584 Coronary atherosclerosis due to calcified coronary lesion: Secondary | ICD-10-CM | POA: Diagnosis not present

## 2021-03-12 ENCOUNTER — Ambulatory Visit: Payer: BC Managed Care – PPO | Admitting: Cardiology

## 2021-03-12 ENCOUNTER — Encounter: Payer: Self-pay | Admitting: Cardiology

## 2021-03-12 ENCOUNTER — Other Ambulatory Visit: Payer: Self-pay

## 2021-03-12 VITALS — BP 146/76 | HR 60 | Resp 16 | Ht 63.0 in | Wt 156.0 lb

## 2021-03-12 DIAGNOSIS — I2510 Atherosclerotic heart disease of native coronary artery without angina pectoris: Secondary | ICD-10-CM | POA: Diagnosis not present

## 2021-03-12 DIAGNOSIS — E7800 Pure hypercholesterolemia, unspecified: Secondary | ICD-10-CM | POA: Diagnosis not present

## 2021-03-12 DIAGNOSIS — I6523 Occlusion and stenosis of bilateral carotid arteries: Secondary | ICD-10-CM

## 2021-03-12 DIAGNOSIS — Z8249 Family history of ischemic heart disease and other diseases of the circulatory system: Secondary | ICD-10-CM

## 2021-03-12 DIAGNOSIS — Z87891 Personal history of nicotine dependence: Secondary | ICD-10-CM

## 2021-03-12 NOTE — Telephone Encounter (Signed)
Done

## 2021-03-12 NOTE — Progress Notes (Signed)
ID:  Robin Bowers, DOB [birth year], MRN [id number]  PCP:  Deland Pretty, MD  Cardiologist:  Rex Kras, DO, [facility] (established care 02/17/2021) Former Cardiology Providers: None  Date: 03/12/21 Last Office Visit: 02/17/2021  Chief Complaint  Patient presents with   Calcification of native coronary artery   Results   Follow-up    HPI  Robin Bowers is a 55 y.o. female who presents to the office with a chief complaint of " coronary calcification, and review test results." Patient's past medical history and cardiovascular risk factors include: Bilateral carotid artery atherosclerosis, hypercholesterolemia, family history of heart disease, insomnia, postmenopausal, former smoker.  She is referred to the office at the request of Deland Pretty, MD for evaluation of atherosclerotic heart disease of native coronary artery without angina pectoris.  Patient recently had a coronary calcium score performed for restratification given family history of CAD.  She was found to have severe coronary artery calcification with a total coronary calcium score of 416 AU.  In addition patient also has bilateral carotid artery stenosis for which she sees providers [facility].  The shared decision was to proceed with an echocardiogram and stress test to evaluate for structural heart disease and reversible ischemia respectively.  Since last office visit patient does not have any chest pain at rest or with effort related activities.  Her overall functional status remains stable.  Echocardiogram notes preserved LVEF, normal diastolic filling pattern, and no significant valvular heart disease.  And nuclear stress test noted normal myocardial perfusion.  Mom and maternal grandmother both had heart disease around the age of 39.  FUNCTIONAL STATUS: Functional status is limited secondary to recent left knee replacement as of December 2021.  ALLERGIES: No Known  Allergies  MEDICATION LIST PRIOR TO VISIT: Current Meds  Medication Sig   Ascorbic Acid (VITAMIN C PO) Take 1 tablet by mouth 2 (two) times a week.   aspirin EC 81 MG tablet Take 81 mg by mouth daily. Swallow whole.   ibuprofen (ADVIL) 200 MG tablet Take 400-600 mg by mouth every 6 (six) hours as needed for headache or moderate pain.   Multiple Vitamin (MULTIVITAMIN ADULT) TABS Take 1 tablet by mouth daily at 12 noon.   rosuvastatin (CRESTOR) 20 MG tablet Take 20 mg by mouth daily.   traZODone (DESYREL) 50 MG tablet Take 50-100 mg by mouth at bedtime as needed.     PAST MEDICAL HISTORY: Past Medical History:  Diagnosis Date   Carotid artery plaque, bilateral    High cholesterol    Lyme disease     PAST SURGICAL HISTORY: Past Surgical History:  Procedure Laterality Date   CESAREAN SECTION     x2   COLONOSCOPY     TOTAL KNEE ARTHROPLASTY Left 08/28/2020   Procedure: LEFT TOTAL KNEE ARTHROPLASTY;  Surgeon: [name], MD;  Location: [place];  Service: Orthopedics;  Laterality: Left;   TUBAL LIGATION     WISDOM TOOTH EXTRACTION      FAMILY HISTORY: The patient family history includes Alzheimer's disease in her maternal grandfather, maternal grandmother, and mother; Breast cancer in her maternal grandmother; Cancer in her father; Heart disease in her maternal grandmother and mother; Hypertension in her mother; Stroke (age of onset: 47) in her mother.  SOCIAL HISTORY:  The patient  reports that she quit smoking about 14 months ago. Her smoking use included cigarettes. She has a 10.00 pack-year smoking history. She has never used smokeless tobacco. She reports previous  alcohol use. She reports that she does not use drugs.  REVIEW OF SYSTEMS: Review of Systems  Constitutional: Negative for chills and fever.  HENT:  Negative for hoarse voice and nosebleeds.   Eyes:  Negative for discharge, double vision and pain.  Cardiovascular:  Negative for chest pain, claudication, dyspnea on  exertion, leg swelling, near-syncope, orthopnea, palpitations, paroxysmal nocturnal dyspnea and syncope.  Respiratory:  Negative for hemoptysis and shortness of breath.   Musculoskeletal:  Negative for muscle cramps and myalgias.  Gastrointestinal:  Negative for abdominal pain, constipation, diarrhea, hematemesis, hematochezia, melena, nausea and vomiting.  Neurological:  Negative for dizziness and light-headedness.   PHYSICAL EXAM: Vitals with BMI 03/12/2021 03/12/2021 03/05/2021  Height - _0  -  Weight - 156 lbs -  BMI - 71.21 -  Systolic 975 883 254  Diastolic 76 75 79  Pulse 60 59 -    CONSTITUTIONAL: Well-developed and well-nourished. No acute distress.  SKIN: Skin is warm and dry. No rash noted. No cyanosis. No pallor. No jaundice HEAD: Normocephalic and atraumatic.  EYES: No scleral icterus MOUTH/THROAT: Moist oral membranes.  NECK: No JVD present. No thyromegaly noted. No carotid bruits  LYMPHATIC: No visible cervical adenopathy.  CHEST Normal respiratory effort. No intercostal retractions  LUNGS: Clear to auscultation bilaterally. No stridor. No wheezes. No rales.  CARDIOVASCULAR: Regular rate and rhythm, positive S1-S2, no murmurs rubs or gallops appreciated. ABDOMINAL: No apparent ascites.  EXTREMITIES: No peripheral edema  HEMATOLOGIC: No significant bruising NEUROLOGIC: Oriented to person, place, and time. Nonfocal. Normal muscle tone.  PSYCHIATRIC: Normal mood and affect. Normal behavior. Cooperative  CARDIAC DATABASE: EKG: 02/17/2021: Sinus bradycardia, 57 bpm, poor R wave progression, without underlying ischemia or injury pattern.   Echocardiogram: 02/22/2021:  Normal LV systolic function with visual EF 60-65%. Left ventricle cavity is normal in size. Mild left ventricular hypertrophy. Normal global wall motion. Normal diastolic filling pattern, normal LAP.  Mild tricuspid regurgitation. No evidence of pulmonary hypertension.  No prior study for  comparison.  Stress Testing: Lexiscan Tetrofosmin stress test 03/08/2021: Lexiscan nuclear stress test performed using 1-day protocol. Normal myocardial perfusion. Stress LVEF 66%. Low risk study.  Coronary artery calcium scoring: Left main: 0. LAD 196. LCx: 0. RCA 220 Total coronary calcium score 416, 99 percentile for same sex, age, and ethnicity matched cohorts  Heart Catheterization: None  Carotid duplex: 03/05/2021: Right Carotid: Velocities in the right ICA are consistent with a 40-59% stenosis. The ECA appears >50% stenosed.  Left Carotid: Velocities in the left ICA are consistent with a 1-39% stenosis.  Vertebrals:  Bilateral vertebral arteries demonstrate antegrade flow.  Subclavians: Normal flow hemodynamics were seen in bilateral subclavian arteries.   LABORATORY DATA: CBC Latest Ref Rng & Units 08/26/2020 03/14/2018 10/02/2013  WBC 4.0 - 10.5 K/uL 6.3 14.1(H) 9.2  Hemoglobin 12.0 - 15.0 g/dL 13.8 14.3 14.6  Hematocrit 36.0 - 46.0 % 42.9 41.3 42.7  Platelets 150 - 400 K/uL 251 219 214    CMP Latest Ref Rng & Units 03/14/2018 10/02/2013 07/20/2012  Glucose 65 - 99 mg/dL 103(H) 89 80  BUN 7 - 25 mg/dL 11 - -  Creatinine 0.50 - 1.05 mg/dL 0.86 - -  Sodium 135 - 146 mmol/L 140 - -  Potassium 3.5 - 5.3 mmol/L 4.5 - -  Chloride 98 - 110 mmol/L 105 - -  CO2 20 - 32 mmol/L 26 - -  Calcium 8.6 - 10.4 mg/dL 9.4 - -  Total Protein 6.1 - 8.1 g/dL 6.8 - -  Total Bilirubin 0.2 - 1.2 mg/dL 0.6 - -  AST 10 - 35 U/L 15 - -  ALT 6 - 29 U/L 13 - -    Lipid Panel     Component Value Date/Time   CHOL 241 (H) 03/14/2018 0941   TRIG 130 03/14/2018 0941   HDL 54 03/14/2018 0941   CHOLHDL 4.5 03/14/2018 0941   VLDL 18 07/20/2012 0843   LDLCALC 162 (H) 03/14/2018 0941    No components found for: NTPROBNP No results for input(s): PROBNP in the last 8760 hours. No results for input(s): TSH in the last 8760 hours.  BMP No results for input(s): NA, K, CL, CO2, GLUCOSE, BUN,  CREATININE, CALCIUM, GFRNONAA, GFRAA in the last 8760 hours.  HEMOGLOBIN A1C No results found for: HGBA1C, MPG   External Labs:  Date Collected: 01/05/2021 , information obtained by PCP Potassium: 5.2 Creatinine 0.73 mg/dL. eGFR: 97 mL/min per 1.73 m Hemoglobin: 13.2 g/dL and hematocrit: 40.6 % AST: 17 , ALT: 14 , alkaline phosphatase: 108  Lipid profile: Collected 01/05/2021 Total cholesterol 206, triglycerides 203, HDL 54, LDL calculated 117 AST 17, ALT 14, alkaline phosphatase 108.   Lab Results  Component Value Date   CHOL 241 (H) 03/14/2018   HDL 54 03/14/2018   LDLCALC 162 (H) 03/14/2018   TRIG 130 03/14/2018   CHOLHDL 4.5 03/14/2018    IMPRESSION:    ICD-10-CM   1. Calcification of native coronary artery  I25.10    I25.84     2. Bilateral carotid artery stenosis  I65.23     3. Pure hypercholesterolemia  E78.00     4. Family history of heart disease  Z82.49     5. Former smoker  Z87.891        RECOMMENDATIONS: Robin Bowers is a 55 y.o. female whose past medical history and cardiac risk factors include: Bilateral carotid artery atherosclerosis, hypercholesterolemia, family history of heart disease, insomnia, postmenopausal, former smoker.  Calcification of native coronary artery Continue aspirin and statin therapy. EKG shows sinus mechanism without underlying ischemia or injury pattern. Results of echo and stress test reviewed with her in great detail as noted above. Educated on importance of risk factor modifications. Patient blood pressure remains elevated over the last several office visits.  Recommended to keep a log of her blood pressures and to review it with her PCP and initiate antihypertensive medications if clinically warranted.  Bilateral carotid artery stenosis. Most recent carotid duplex 02/2021 reviewed. Patient follows up with vascular and vein specialist.  Disease involving the right ICA is greater than the left ICA. Educated on  risk factor modifications and continuing aspirin and statin therapy for now.  Pure hypercholesterolemia Most recent lipid profile done by PCP.  Currently on Crestor 20 mg p.o. nightly. Does not endorse any myalgias.  Family history of heart disease Management as discussed above.  Former smoker: Educated on the importance of continued smoking cessation.  Recommend 1 year follow-up after her yearly physical.  She is advised to bring in her labs for review and reference.  More than welcome to follow-up sooner if change in clinical status.  FINAL MEDICATION LIST END OF ENCOUNTER: No orders of the defined types were placed in this encounter.   There are no discontinued medications.    Current Outpatient Medications:    Ascorbic Acid (VITAMIN C PO), Take 1 tablet by mouth 2 (two) times a week., Disp: , Rfl:    aspirin EC 81 MG tablet, Take 81 mg by  mouth daily. Swallow whole., Disp: , Rfl:    ibuprofen (ADVIL) 200 MG tablet, Take 400-600 mg by mouth every 6 (six) hours as needed for headache or moderate pain., Disp: , Rfl:    Multiple Vitamin (MULTIVITAMIN ADULT) TABS, Take 1 tablet by mouth daily at 12 noon., Disp: , Rfl:    rosuvastatin (CRESTOR) 20 MG tablet, Take 20 mg by mouth daily., Disp: , Rfl:    traZODone (DESYREL) 50 MG tablet, Take 50-100 mg by mouth at bedtime as needed., Disp: , Rfl:   No orders of the defined types were placed in this encounter.   There are no Patient Instructions on file for this visit.   --Continue cardiac medications as reconciled in final medication list. --Return in about 1 year (around 03/12/2022) for Follow up, Coronary artery calcification. Or sooner if needed. --Continue follow-up with your primary care physician regarding the management of your other chronic comorbid conditions.  Patient's questions and concerns were addressed to her satisfaction. She voices understanding of the instructions provided during this encounter.   This note was  created using a voice recognition software as a result there may be grammatical errors inadvertently enclosed that do not reflect the nature of this encounter. Every attempt is made to correct such errors.  Rex Kras, [place], [facility]  Pager: [phone] Office: [phone]

## 2021-03-15 ENCOUNTER — Ambulatory Visit: Payer: BC Managed Care – PPO | Admitting: Cardiology

## 2021-03-23 ENCOUNTER — Ambulatory Visit: Payer: BC Managed Care – PPO | Admitting: Cardiology

## 2021-07-15 DIAGNOSIS — Z713 Dietary counseling and surveillance: Secondary | ICD-10-CM | POA: Diagnosis not present

## 2021-07-28 DIAGNOSIS — I2510 Atherosclerotic heart disease of native coronary artery without angina pectoris: Secondary | ICD-10-CM | POA: Diagnosis not present

## 2021-07-28 DIAGNOSIS — E785 Hyperlipidemia, unspecified: Secondary | ICD-10-CM | POA: Diagnosis not present

## 2021-07-28 DIAGNOSIS — Z713 Dietary counseling and surveillance: Secondary | ICD-10-CM | POA: Diagnosis not present

## 2021-07-28 DIAGNOSIS — Z7182 Exercise counseling: Secondary | ICD-10-CM | POA: Diagnosis not present

## 2021-08-11 DIAGNOSIS — Z713 Dietary counseling and surveillance: Secondary | ICD-10-CM | POA: Diagnosis not present

## 2021-08-26 DIAGNOSIS — Z713 Dietary counseling and surveillance: Secondary | ICD-10-CM | POA: Diagnosis not present

## 2021-10-20 DIAGNOSIS — Z0000 Encounter for general adult medical examination without abnormal findings: Secondary | ICD-10-CM | POA: Diagnosis not present

## 2021-10-20 DIAGNOSIS — E7800 Pure hypercholesterolemia, unspecified: Secondary | ICD-10-CM | POA: Diagnosis not present

## 2021-10-25 ENCOUNTER — Other Ambulatory Visit: Payer: Self-pay | Admitting: Internal Medicine

## 2021-10-25 DIAGNOSIS — Z1231 Encounter for screening mammogram for malignant neoplasm of breast: Secondary | ICD-10-CM

## 2021-10-27 DIAGNOSIS — Z8249 Family history of ischemic heart disease and other diseases of the circulatory system: Secondary | ICD-10-CM | POA: Diagnosis not present

## 2021-10-27 DIAGNOSIS — I2510 Atherosclerotic heart disease of native coronary artery without angina pectoris: Secondary | ICD-10-CM | POA: Diagnosis not present

## 2021-10-27 DIAGNOSIS — Z0000 Encounter for general adult medical examination without abnormal findings: Secondary | ICD-10-CM | POA: Diagnosis not present

## 2021-10-27 DIAGNOSIS — Z01419 Encounter for gynecological examination (general) (routine) without abnormal findings: Secondary | ICD-10-CM | POA: Diagnosis not present

## 2021-10-27 DIAGNOSIS — Z1151 Encounter for screening for human papillomavirus (HPV): Secondary | ICD-10-CM | POA: Diagnosis not present

## 2021-11-23 ENCOUNTER — Ambulatory Visit: Payer: BC Managed Care – PPO

## 2021-12-15 ENCOUNTER — Ambulatory Visit
Admission: RE | Admit: 2021-12-15 | Discharge: 2021-12-15 | Disposition: A | Discharge status: Home / Self Care | Payer: BC Managed Care – PPO | Source: Ambulatory Visit | Attending: Internal Medicine | Admitting: Internal Medicine

## 2021-12-15 DIAGNOSIS — Z1231 Encounter for screening mammogram for malignant neoplasm of breast: Secondary | ICD-10-CM | POA: Diagnosis not present

## 2022-01-26 DIAGNOSIS — R7301 Impaired fasting glucose: Secondary | ICD-10-CM | POA: Diagnosis not present

## 2022-01-26 DIAGNOSIS — E7800 Pure hypercholesterolemia, unspecified: Secondary | ICD-10-CM | POA: Diagnosis not present

## 2022-05-25 DIAGNOSIS — E7800 Pure hypercholesterolemia, unspecified: Secondary | ICD-10-CM | POA: Diagnosis not present

## 2022-05-25 DIAGNOSIS — R7301 Impaired fasting glucose: Secondary | ICD-10-CM | POA: Diagnosis not present

## 2022-11-02 ENCOUNTER — Other Ambulatory Visit: Payer: Self-pay | Admitting: *Deleted

## 2022-11-02 DIAGNOSIS — I6523 Occlusion and stenosis of bilateral carotid arteries: Secondary | ICD-10-CM

## 2022-11-15 NOTE — Progress Notes (Unsigned)
HISTORY AND PHYSICAL     CC:  follow up. Requesting Provider:  Deland Pretty, MD  HPI: This is a 57 y.o. female here for follow up for carotid artery stenosis.    Pt was last seen June 2022 by Dr. Donzetta Matters and at that time, she was doing well and asymptomatic. She was compliant with her asa/statin.  She had right 40-59% right ICA stenosis and 1-39% left ICA stenosis.  Pt returns today for follow up.    Pt denies any amaurosis fugax, speech difficulties, weakness, numbness, paralysis or clumsiness or facial droop.  She states a while back she had a little dizziness but this resolved in a couple of days.  She has had a left knee replacement since she was last here.  She denies any claudication, rest pain or non healing wounds.  She does have some cramping in her calves at night.     The pt is on a statin for cholesterol management.  The pt is on a daily aspirin.   Other AC:  none The pt is not on medication for hypertension.   The pt does not have diabetes Tobacco hx:  former  Pt does not have family hx of AAA.  Past Medical History:  Diagnosis Date   Carotid artery plaque, bilateral    High cholesterol    Lyme disease     Past Surgical History:  Procedure Laterality Date   CESAREAN SECTION     x2   COLONOSCOPY     TOTAL KNEE ARTHROPLASTY Left 08/28/2020   Procedure: LEFT TOTAL KNEE ARTHROPLASTY;  Surgeon: [name], MD;  Location: [place];  Service: Orthopedics;  Laterality: Left;   TUBAL LIGATION     WISDOM TOOTH EXTRACTION      No Known Allergies  Current Outpatient Medications  Medication Sig Dispense Refill   Ascorbic Acid (VITAMIN C PO) Take 1 tablet by mouth 2 (two) times a week.     aspirin EC 81 MG tablet Take 81 mg by mouth daily. Swallow whole.     ibuprofen (ADVIL) 200 MG tablet Take 400-600 mg by mouth every 6 (six) hours as needed for headache or moderate pain.     Multiple Vitamin (MULTIVITAMIN ADULT) TABS Take 1 tablet by mouth daily at 12 noon.      rosuvastatin (CRESTOR) 20 MG tablet Take 20 mg by mouth daily.     traZODone (DESYREL) 50 MG tablet Take 50-100 mg by mouth at bedtime as needed.     No current facility-administered medications for this visit.    Family History  Problem Relation Age of Onset   Cancer Father        colon cancer- early 60's    Alzheimer's disease Mother    Stroke Mother 68   Hypertension Mother    Heart disease Mother    Breast cancer Maternal Grandmother        after menopause   Heart disease Maternal Grandmother    Alzheimer's disease Maternal Grandmother    Alzheimer's disease Maternal Grandfather     Social History   Socioeconomic History   Marital status: Divorced    Spouse name: Not on file   Number of children: Not on file   Years of education: Not on file   Highest education level: Not on file  Occupational History   Not on file  Tobacco Use   Smoking status: Former    Packs/day: 0.50    Years: 20.00    Total pack years:  10.00    Types: Cigarettes    Quit date: 12/29/2019    Years since quitting: 2.8   Smokeless tobacco: Never  Vaping Use   Vaping Use: Never used  Substance and Sexual Activity   Alcohol use: Not Currently    Comment: rarely   Drug use: No   Sexual activity: Yes    Birth control/protection: Surgical    Comment: intercourse age 26, more than 19 sexual partners, des neg  Other Topics Concern   Not on file  Social History Narrative   Not on file   Social Determinants of Health   Financial Resource Strain: Not on file  Food Insecurity: Not on file  Transportation Needs: Not on file  Physical Activity: Not on file  Stress: Not on file  Social Connections: Not on file  Intimate Partner Violence: Not on file     REVIEW OF SYSTEMS:   '[X]'$  denotes positive finding, '[ ]'$  denotes negative finding Cardiac  Comments:  Chest pain or chest pressure:    Shortness of breath upon exertion:    Short of breath when lying flat:    Irregular heart rhythm:         Vascular    Pain in calf, thigh, or hip brought on by ambulation:    Pain in feet at night that wakes you up from your sleep:     Blood clot in your veins:    Leg swelling:         Pulmonary    Oxygen at home:    Productive cough:     Wheezing:         Neurologic    Sudden weakness in arms or legs:     Sudden numbness in arms or legs:     Sudden onset of difficulty speaking or slurred speech:    Temporary loss of vision in one eye:     Problems with dizziness:         Gastrointestinal    Blood in stool:     Vomited blood:         Genitourinary    Burning when urinating:     Blood in urine:        Psychiatric    Major depression:         Hematologic    Bleeding problems:    Problems with blood clotting too easily:        Skin    Rashes or ulcers:        Constitutional    Fever or chills:      PHYSICAL EXAMINATION:  Today's Vitals   11/16/22 0801 11/16/22 0804  BP: (!) 144/71 (!) 153/76  Pulse: (!) 51   Resp: 20   Temp: 97.8 F (36.6 C)   TempSrc: Temporal   SpO2: 100%   Weight: 155 lb 6.4 oz (70.5 kg)   Height: '5\' 3"'$  (1.6 m)    Body mass index is 27.53 kg/m.   General:  WDWN in NAD; vital signs documented above Gait: Not observed HENT: WNL, normocephalic Pulmonary: normal non-labored breathing Cardiac: regular HR, without carotid bruits Abdomen: soft, NT; aortic pulse is not palpable Skin: without rashes; tattoo left forearm for her mother (alzheimer's/stroke) Vascular Exam/Pulses:  Right Left  Radial 2+ (normal) 2+ (normal)   Extremities: without open wounds Musculoskeletal: no muscle wasting or atrophy  Neurologic: A&O X 3; moving all extremities equally; speech is fluent/normal Psychiatric:  The pt has Normal affect.   Non-Invasive Vascular Imaging:  Carotid Duplex on 11/16/2022 Right:  1-39% ICA stenosis Left:  1-39% ICA stenosis Vertebrals:  Bilateral vertebral arteries demonstrate antegrade flow.  Subclavians: Normal flow  hemodynamics were seen in bilateral subclavian arteries.   Previous Carotid duplex on 03/05/2021: Right: 40-59% ICA stenosis Left:   1-39% ICA stenosis    ASSESSMENT/PLAN:: 57 y.o. female here for follow up carotid artery stenosis   -duplex today reveals improvement with 1-39% bilateral ICA stenosis and she remains asymptomatic.  Will have her return in one year for repeat duplex and if it still remains in the 1-39% stenosis range, will push her follow up out a couple of years.   -discussed s/s of stroke with pt and she understands should she develop any of these sx, she will go to the nearest ER or call 911. -pt will f/u in one year with carotid duplex -pt will call sooner should she have any issues. -continue statin/asa  -fortunately she quit smoking 3 years ago.   Leontine Locket, [facility] [phone]  Clinic MD:  Donzetta Matters

## 2022-11-16 ENCOUNTER — Ambulatory Visit (INDEPENDENT_AMBULATORY_CARE_PROVIDER_SITE_OTHER): Payer: BC Managed Care – PPO | Admitting: Physician Assistant

## 2022-11-16 ENCOUNTER — Ambulatory Visit (HOSPITAL_COMMUNITY)
Admission: RE | Admit: 2022-11-16 | Discharge: 2022-11-16 | Disposition: A | Discharge status: Home / Self Care | Payer: BC Managed Care – PPO | Source: Ambulatory Visit | Attending: Vascular Surgery | Admitting: Vascular Surgery

## 2022-11-16 VITALS — BP 153/76 | HR 51 | Temp 97.8°F | Resp 20 | Ht 63.0 in | Wt 155.4 lb

## 2022-11-16 DIAGNOSIS — I6523 Occlusion and stenosis of bilateral carotid arteries: Secondary | ICD-10-CM | POA: Insufficient documentation

## 2022-12-08 DIAGNOSIS — R030 Elevated blood-pressure reading, without diagnosis of hypertension: Secondary | ICD-10-CM | POA: Diagnosis not present

## 2022-12-13 DIAGNOSIS — E7800 Pure hypercholesterolemia, unspecified: Secondary | ICD-10-CM | POA: Diagnosis not present

## 2022-12-13 DIAGNOSIS — R7303 Prediabetes: Secondary | ICD-10-CM | POA: Diagnosis not present

## 2022-12-13 DIAGNOSIS — R030 Elevated blood-pressure reading, without diagnosis of hypertension: Secondary | ICD-10-CM | POA: Diagnosis not present

## 2022-12-20 DIAGNOSIS — Z0000 Encounter for general adult medical examination without abnormal findings: Secondary | ICD-10-CM | POA: Diagnosis not present

## 2022-12-27 ENCOUNTER — Encounter: Payer: Self-pay | Admitting: Cardiology

## 2022-12-27 ENCOUNTER — Ambulatory Visit: Payer: BC Managed Care – PPO | Admitting: Cardiology

## 2022-12-27 VITALS — BP 152/69 | HR 64 | Resp 16 | Ht 63.0 in | Wt 156.2 lb

## 2022-12-27 DIAGNOSIS — I6523 Occlusion and stenosis of bilateral carotid arteries: Secondary | ICD-10-CM

## 2022-12-27 DIAGNOSIS — I2510 Atherosclerotic heart disease of native coronary artery without angina pectoris: Secondary | ICD-10-CM

## 2022-12-27 DIAGNOSIS — R072 Precordial pain: Secondary | ICD-10-CM

## 2022-12-27 DIAGNOSIS — Z8249 Family history of ischemic heart disease and other diseases of the circulatory system: Secondary | ICD-10-CM

## 2022-12-27 DIAGNOSIS — E7800 Pure hypercholesterolemia, unspecified: Secondary | ICD-10-CM | POA: Diagnosis not present

## 2022-12-27 DIAGNOSIS — I2584 Coronary atherosclerosis due to calcified coronary lesion: Secondary | ICD-10-CM | POA: Diagnosis not present

## 2022-12-27 DIAGNOSIS — I10 Essential (primary) hypertension: Secondary | ICD-10-CM

## 2022-12-27 DIAGNOSIS — Z87891 Personal history of nicotine dependence: Secondary | ICD-10-CM

## 2022-12-27 MED ORDER — HYDROCHLOROTHIAZIDE 25 MG PO TABS: 25.0000 mg | ORAL_TABLET | ORAL | 0 refills | Status: DC

## 2022-12-27 MED ORDER — LOSARTAN POTASSIUM 50 MG PO TABS: 50.0000 mg | ORAL_TABLET | Freq: Every day | ORAL | 0 refills | Status: DC

## 2022-12-27 NOTE — Progress Notes (Addendum)
ID:  Robin Bowers, DOB 1966/09/10, MRN [id number]  PCP:  Merri Brunette, MD  Cardiologist:  Tessa Lerner, DO, [facility] (established care 02/17/2021) Former Cardiology Providers: None  Date: 12/27/22 Last Office Visit:03/12/2021  Chief Complaint  Patient presents with   Follow-up    Follow-up multiple complaints.  History of coronary calcification, chest pain, recent initiation of blood pressure pills    HPI  Robin Bowers is a 57 y.o. female whose past medical history and cardiovascular risk factors include: Coronary artery calcification, bilateral carotid artery atherosclerosis, hypercholesterolemia, family history of heart disease, insomnia, postmenopausal, former smoker.  Patient has known history of severe coronary artery calcification with a prior score being 416 AU.  Thereafter she did undergo echo and stress test results noted below for further reference.  She presents today for follow-up (last seen in the office in 03/08/2021).  Since last office visit, patient states that she is doing well overall.  However about 1 month ago she noticed an episode of chest pressure.  This happened when she was shoveling horse manure, substernal/left-sided, brought on by overexertion, improves with resting/self-limited.  The discomfort resurfaced when she overexerted again when moving horse manure..  Patient stated she was also diagnosed with uncontrolled hypertension approximately 2 weeks ago she was started on blood pressure pills.  She is currently on losartan 25 mg p.o. daily.  She currently uses a wrist device called the band to monitor her blood pressures which no better blood pressure readings than the conventional sphygmomanometer.  Her daughter checks her blood pressures often and recently has been running 148/76.  She denies any shortness of breath at rest or with effort related activities.  She currently is walking 4-5 times per week 1 mile each.  She is requesting  assistance with blood pressure management.  Given her carotid artery disease and she is following with vascular vein specialist.  Mom and maternal grandmother both had heart disease around the age of 63.  FUNCTIONAL STATUS: Functional status is limited secondary to recent left knee replacement as of December 2021.  ALLERGIES: No Known Allergies  MEDICATION LIST PRIOR TO VISIT: Current Meds  Medication Sig   aspirin EC 81 MG tablet Take 81 mg by mouth daily. Swallow whole.   hydrochlorothiazide (HYDRODIURIL) 25 MG tablet Take 1 tablet (25 mg total) by mouth every morning.   Multiple Vitamin (MULTIVITAMIN ADULT) TABS Take 1 tablet by mouth daily at 12 noon.   rosuvastatin (CRESTOR) 20 MG tablet Take 20 mg by mouth daily.   [DISCONTINUED] losartan (COZAAR) 25 MG tablet Take 25 mg by mouth daily.     PAST MEDICAL HISTORY: Past Medical History:  Diagnosis Date   Carotid artery plaque, bilateral    Coronary artery disease due to calcified coronary lesion    High cholesterol    Lyme disease     PAST SURGICAL HISTORY: Past Surgical History:  Procedure Laterality Date   CESAREAN SECTION     x2   COLONOSCOPY     TOTAL KNEE ARTHROPLASTY Left 08/28/2020   Procedure: LEFT TOTAL KNEE ARTHROPLASTY;  Surgeon: Nadara Mustard, MD;  Location: [facility] OR;  Service: Orthopedics;  Laterality: Left;   TUBAL LIGATION     WISDOM TOOTH EXTRACTION      FAMILY HISTORY: The patient family history includes Alzheimer's disease in her maternal grandfather, maternal grandmother, and mother; Breast cancer in her maternal grandmother; Cancer in her father; Heart disease in her maternal grandmother and mother; Hypertension in her mother;  Stroke (age of onset: 67) in her mother.  SOCIAL HISTORY:  The patient  reports that she quit smoking about 2 years ago. Her smoking use included cigarettes. She has a 10.00 pack-year smoking history. She has never been exposed to tobacco smoke. She has never used smokeless  tobacco. She reports that she does not currently use alcohol. She reports that she does not use drugs.  REVIEW OF SYSTEMS: Review of Systems  Constitutional: Negative for chills and fever.  HENT:  Negative for hoarse voice and nosebleeds.   Eyes:  Negative for discharge, double vision and pain.  Cardiovascular:  Positive for chest pain (see HPI). Negative for claudication, dyspnea on exertion, leg swelling, near-syncope, orthopnea, palpitations, paroxysmal nocturnal dyspnea and syncope.  Respiratory:  Negative for hemoptysis and shortness of breath.   Musculoskeletal:  Positive for back pain. Negative for muscle cramps and myalgias.  Gastrointestinal:  Negative for abdominal pain, constipation, diarrhea, hematemesis, hematochezia, melena, nausea and vomiting.  Neurological:  Negative for dizziness and light-headedness.    PHYSICAL EXAM:    12/27/2022   11:39 AM 11/16/2022    8:04 AM 11/16/2022    8:01 AM  Vitals with BMI  Height 5\' 3"   5\' 3"   Weight 156 lbs 3 oz  155 lbs 6 oz  BMI 27.68  27.53  Systolic 152 153 256  Diastolic 69 76 71  Pulse 64  51   Physical Exam  Constitutional: No distress.  Age appropriate, hemodynamically stable.   Neck: No JVD present.  Cardiovascular: Normal rate, regular rhythm, S1 normal, S2 normal, intact distal pulses and normal pulses. Exam reveals no gallop, no S3 and no S4.  No murmur heard. Pulmonary/Chest: Effort normal and breath sounds normal. No stridor. She has no wheezes. She has no rales.  Abdominal: Soft. Bowel sounds are normal. She exhibits no distension. There is no abdominal tenderness.  Musculoskeletal:        General: No edema.     Cervical back: Neck supple.  Neurological: She is alert and oriented to person, place, and time. She has intact cranial nerves (2-12).  Skin: Skin is warm and moist.   CARDIAC DATABASE: EKG: 12/27/2022: Sinus rhythm, 60 bpm, normal axis, without underlying ischemia injury  pattern.  Echocardiogram: 02/22/2021:  Normal LV systolic function with visual EF 60-65%. Left ventricle cavity is normal in size. Mild left ventricular hypertrophy. Normal global wall motion. Normal diastolic filling pattern, normal LAP.  Mild tricuspid regurgitation. No evidence of pulmonary hypertension.  No prior study for comparison.  Stress Testing: Lexiscan Tetrofosmin stress test 03/08/2021: Lexiscan nuclear stress test performed using 1-day protocol. Normal myocardial perfusion. Stress LVEF 66%. Low risk study.  Coronary artery calcium scoring: Left main: 0. LAD 196. LCx: 0. RCA 220 Total coronary calcium score 416, 99 percentile for same sex, age, and ethnicity matched cohorts  Heart Catheterization: None  Carotid duplex: 03/05/2021: Right Carotid: Velocities in the right ICA are consistent with a 40-59% stenosis. The ECA appears >50% stenosed.  Left Carotid: Velocities in the left ICA are consistent with a 1-39% stenosis.  Vertebrals:  Bilateral vertebral arteries demonstrate antegrade flow.  Subclavians: Normal flow hemodynamics were seen in bilateral subclavian arteries.   11/16/2022: Right Carotid: Velocities in the right ICA are consistent with a 1-39%  stenosis.   Left Carotid: Velocities in the left ICA are consistent with a 1-39%  stenosis.   Vertebrals: Bilateral vertebral arteries demonstrate antegrade flow.  Subclavians: Normal flow hemodynamics were seen in bilateral subclavian  arteries.   LABORATORY DATA:    Latest Ref Rng & Units 08/26/2020    9:39 AM 03/14/2018    9:41 AM 10/02/2013    9:13 AM  CBC  WBC 4.0 - 10.5 K/uL 6.3  14.1  9.2   Hemoglobin 12.0 - 15.0 g/dL 79.013.8  24.014.3  97.314.6   Hematocrit 36.0 - 46.0 % 42.9  41.3  42.7   Platelets 150 - 400 K/uL 251  219  214        Latest Ref Rng & Units 03/14/2018    9:41 AM 10/02/2013    9:13 AM 07/20/2012    8:43 AM  CMP  Glucose 65 - 99 mg/dL 532103  89  80   BUN 7 - 25 mg/dL 11      Creatinine 9.920.50 - 1.05 mg/dL 4.260.86     Sodium 834135 - 196146 mmol/L 140     Potassium 3.5 - 5.3 mmol/L 4.5     Chloride 98 - 110 mmol/L 105     CO2 20 - 32 mmol/L 26     Calcium 8.6 - 10.4 mg/dL 9.4     Total Protein 6.1 - 8.1 g/dL 6.8     Total Bilirubin 0.2 - 1.2 mg/dL 0.6     AST 10 - 35 U/L 15     ALT 6 - 29 U/L 13       Lipid Panel     Component Value Date/Time   CHOL 241 (H) 03/14/2018 0941   TRIG 130 03/14/2018 0941   HDL 54 03/14/2018 0941   CHOLHDL 4.5 03/14/2018 0941   VLDL 18 07/20/2012 0843   LDLCALC 162 (H) 03/14/2018 0941    No components found for: "NTPROBNP" No results for input(s): "PROBNP" in the last 8760 hours. No results for input(s): "TSH" in the last 8760 hours.  BMP No results for input(s): "NA", "K", "CL", "CO2", "GLUCOSE", "BUN", "CREATININE", "CALCIUM", "GFRNONAA", "GFRAA" in the last 8760 hours.  HEMOGLOBIN A1C No results found for: "HGBA1C", "MPG"   External Labs:  Date Collected: 01/05/2021 , information obtained by PCP Potassium: 5.2 Creatinine 0.73 mg/dL. eGFR: 97 mL/min per 1.73 m Hemoglobin: 13.2 g/dL and hematocrit: 22.240.6 % AST: 17 , ALT: 14 , alkaline phosphatase: 108  Lipid profile: Collected 01/05/2021 Total cholesterol 206, triglycerides 203, HDL 54, LDL calculated 117 AST 17, ALT 14, alkaline phosphatase 108.  External Labs: Collected: 12/13/2022 provided by the patient. A1c 5.9. Total cholesterol 142, triglycerides 110, HDL 61, LDL calculated 59, non-HDL 81 hemoglobin 13.6, hematocrit 41.3%.  IMPRESSION:    ICD-10-CM   1. Calcification of native coronary artery  I25.10 EKG 12-Lead   I25.84     2. Precordial pain  R07.2     3. Benign hypertension  I10 hydrochlorothiazide (HYDRODIURIL) 25 MG tablet    losartan (COZAAR) 50 MG tablet    Basic metabolic panel    Magnesium    4. Bilateral carotid artery stenosis  I65.23     5. Pure hypercholesterolemia  E78.00     6. Family history of heart disease  Z82.49     7.  Former smoker  Z87.891        RECOMMENDATIONS: Robin Bowers is a 57 y.o. female whose past medical history and cardiac risk factors include: Coronary artery calcification, bilateral carotid artery atherosclerosis, hypercholesterolemia, family history of heart disease, insomnia, postmenopausal, former smoker.  Calcification of native coronary artery Precordial pain Total CAC 416, 99 percentile Continue aspirin and statin therapy. EKG: Nonischemic. Prior  echo and stress test results reviewed. Her precordial discomfort has both cardiac and noncardiac features. We discussed undergoing ischemic evaluation along with better blood pressure management versus correcting her blood pressures first and if symptoms continue we will proceed with additional ischemic workup.  Patient prefers the latter. However, if the symptoms increase in intensity frequency or duration she is asked to go to the closest ER via EMS for evaluation and management.  Benign hypertension Office and home blood pressures are not well-controlled. Patient requesting assistance with blood pressure management. Discontinue losartan 25 mg p.o. daily. Start hydrochlorothiazide 25 mg p.o. every morning. Increase losartan to 50 mg p.o. every afternoon. Labs in 1 week to evaluate kidney function and electrolytes. Reemphasized importance of low-salt diet.  Bilateral carotid artery stenosis Most recent carotid duplex notes improvement in overall disease burden. Currently follows with vascular and vein specialists  Pure hypercholesterolemia Indexed LDL level 162 mg/dL. Most recent LDL levels 59 mg/dL Continue current statin therapy. Does not endorse myalgias. Outside records independently reviewed, provided by patient.  FINAL MEDICATION LIST END OF ENCOUNTER: Meds ordered this encounter  Medications   hydrochlorothiazide (HYDRODIURIL) 25 MG tablet    Sig: Take 1 tablet (25 mg total) by mouth every morning.    Dispense:   90 tablet    Refill:  0   losartan (COZAAR) 50 MG tablet    Sig: Take 1 tablet (50 mg total) by mouth daily at 10 pm.    Dispense:  30 tablet    Refill:  0    Medications Discontinued During This Encounter  Medication Reason   losartan (COZAAR) 25 MG tablet Reorder    Current Outpatient Medications:    aspirin EC 81 MG tablet, Take 81 mg by mouth daily. Swallow whole., Disp: , Rfl:    hydrochlorothiazide (HYDRODIURIL) 25 MG tablet, Take 1 tablet (25 mg total) by mouth every morning., Disp: 90 tablet, Rfl: 0   Multiple Vitamin (MULTIVITAMIN ADULT) TABS, Take 1 tablet by mouth daily at 12 noon., Disp: , Rfl:    rosuvastatin (CRESTOR) 20 MG tablet, Take 20 mg by mouth daily., Disp: , Rfl:    losartan (COZAAR) 50 MG tablet, Take 1 tablet (50 mg total) by mouth daily at 10 pm., Disp: 30 tablet, Rfl: 0  Orders Placed This Encounter  Procedures   Basic metabolic panel   Magnesium   EKG 12-Lead   There are no Patient Instructions on file for this visit.   --Continue cardiac medications as reconciled in final medication list. --Return in about 5 weeks (around 01/31/2023) for Follow up, BP. Or sooner if needed. --Continue follow-up with your primary care physician regarding the management of your other chronic comorbid conditions.  Patient's questions and concerns were addressed to her satisfaction. She voices understanding of the instructions provided during this encounter.   This note was created using a voice recognition software as a result there may be grammatical errors inadvertently enclosed that do not reflect the nature of this encounter. Every attempt is made to correct such errors.  Tessa Lerner, [place], [facility]  Pager:  [phone] Office: [phone]

## 2023-01-02 DIAGNOSIS — E782 Mixed hyperlipidemia: Secondary | ICD-10-CM | POA: Diagnosis not present

## 2023-01-02 DIAGNOSIS — I6523 Occlusion and stenosis of bilateral carotid arteries: Secondary | ICD-10-CM | POA: Diagnosis not present

## 2023-01-02 DIAGNOSIS — Z1330 Encounter for screening examination for mental health and behavioral disorders, unspecified: Secondary | ICD-10-CM | POA: Diagnosis not present

## 2023-01-02 DIAGNOSIS — I10 Essential (primary) hypertension: Secondary | ICD-10-CM | POA: Diagnosis not present

## 2023-01-02 DIAGNOSIS — R931 Abnormal findings on diagnostic imaging of heart and coronary circulation: Secondary | ICD-10-CM | POA: Diagnosis not present

## 2023-01-03 ENCOUNTER — Other Ambulatory Visit: Payer: Self-pay

## 2023-01-03 ENCOUNTER — Other Ambulatory Visit: Payer: Self-pay | Admitting: Physician Assistant

## 2023-01-03 ENCOUNTER — Other Ambulatory Visit: Payer: Self-pay | Admitting: Cardiology

## 2023-01-03 DIAGNOSIS — I10 Essential (primary) hypertension: Secondary | ICD-10-CM | POA: Diagnosis not present

## 2023-01-03 DIAGNOSIS — Z1231 Encounter for screening mammogram for malignant neoplasm of breast: Secondary | ICD-10-CM

## 2023-01-04 ENCOUNTER — Ambulatory Visit
Admission: RE | Admit: 2023-01-04 | Discharge: 2023-01-04 | Disposition: A | Discharge status: Home / Self Care | Payer: BC Managed Care – PPO | Source: Ambulatory Visit | Attending: Physician Assistant | Admitting: Physician Assistant

## 2023-01-04 DIAGNOSIS — Z1231 Encounter for screening mammogram for malignant neoplasm of breast: Secondary | ICD-10-CM

## 2023-01-04 LAB — BASIC METABOLIC PANEL
BUN/Creatinine Ratio: 16 (ref 9–23)
BUN: 13 mg/dL (ref 6–24)
CO2: 27 mmol/L (ref 20–29)
Calcium: 10 mg/dL (ref 8.7–10.2)
Chloride: 93 mmol/L — ABNORMAL LOW (ref 96–106)
Creatinine, Ser: 0.79 mg/dL (ref 0.57–1.00)
Glucose: 115 mg/dL — ABNORMAL HIGH (ref 70–99)
Potassium: 4.4 mmol/L (ref 3.5–5.2)
Sodium: 134 mmol/L (ref 134–144)
eGFR: 87 mL/min/{1.73_m2} (ref >59)

## 2023-01-04 LAB — MAGNESIUM: Magnesium: 2.3 mg/dL (ref 1.6–2.3)

## 2023-01-06 ENCOUNTER — Telehealth: Payer: Self-pay

## 2023-01-06 NOTE — Progress Notes (Signed)
Called patient no answer left a vm to call back

## 2023-01-06 NOTE — Progress Notes (Signed)
Patient called back, I have discussed results with her.  She did want you to know that she has been taking 1/2 tablet of Losartan in the morning and 1/2 tablet in the evening but a total of 1 tablet daily. She also asked if she should she be concerned that her "Chloride" has dropped. Please advise.

## 2023-01-10 NOTE — Progress Notes (Signed)
Patient is aware 

## 2023-01-20 ENCOUNTER — Other Ambulatory Visit: Payer: Self-pay

## 2023-01-20 DIAGNOSIS — I10 Essential (primary) hypertension: Secondary | ICD-10-CM

## 2023-01-20 MED ORDER — LOSARTAN POTASSIUM 50 MG PO TABS: 50.0000 mg | ORAL_TABLET | Freq: Every day | ORAL | 0 refills | Status: DC

## 2023-02-06 IMAGING — MG MM DIGITAL SCREENING BILAT W/ TOMO AND CAD
8 series · 9 of 24 positions shown · non-contrast
Comparison: Previous exam(s).

CLINICAL DATA: Screening.

EXAM:
DIGITAL SCREENING BILATERAL MAMMOGRAM WITH TOMOSYNTHESIS AND CAD
TECHNIQUE: Bilateral screening digital craniocaudal and mediolateral oblique
mammograms were obtained. Bilateral screening digital breast
tomosynthesis was performed. The images were evaluated with
computer-aided detection.

[R CC synth-2D]
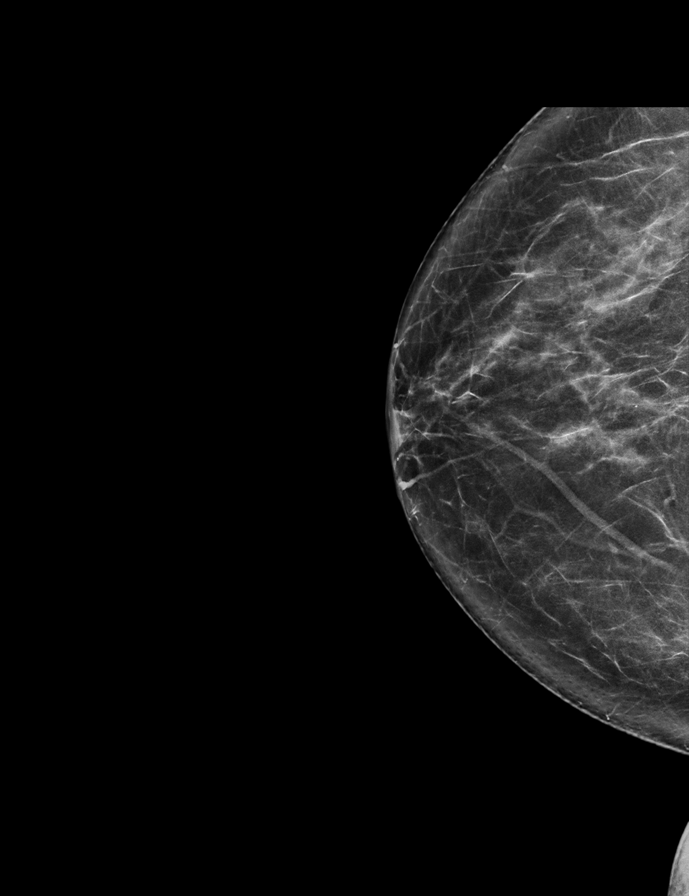

[R MLO synth-2D]
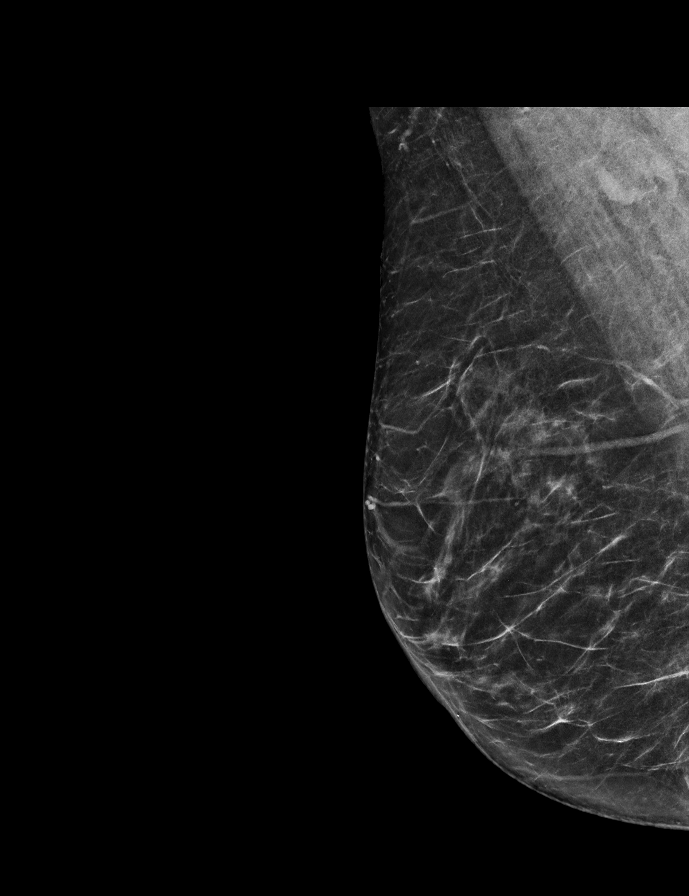

[L CC synth-2D]
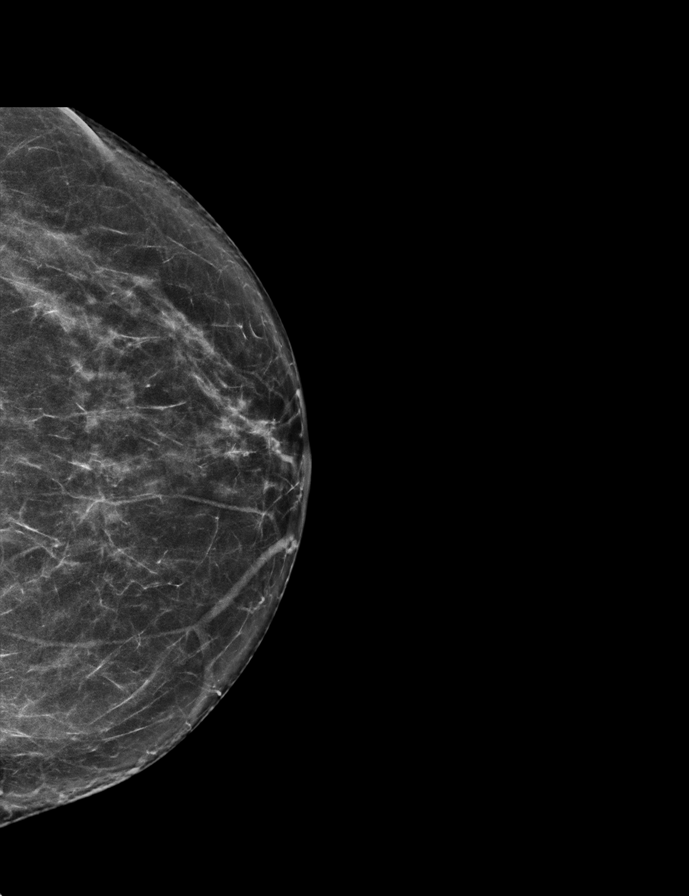

[L MLO synth-2D]
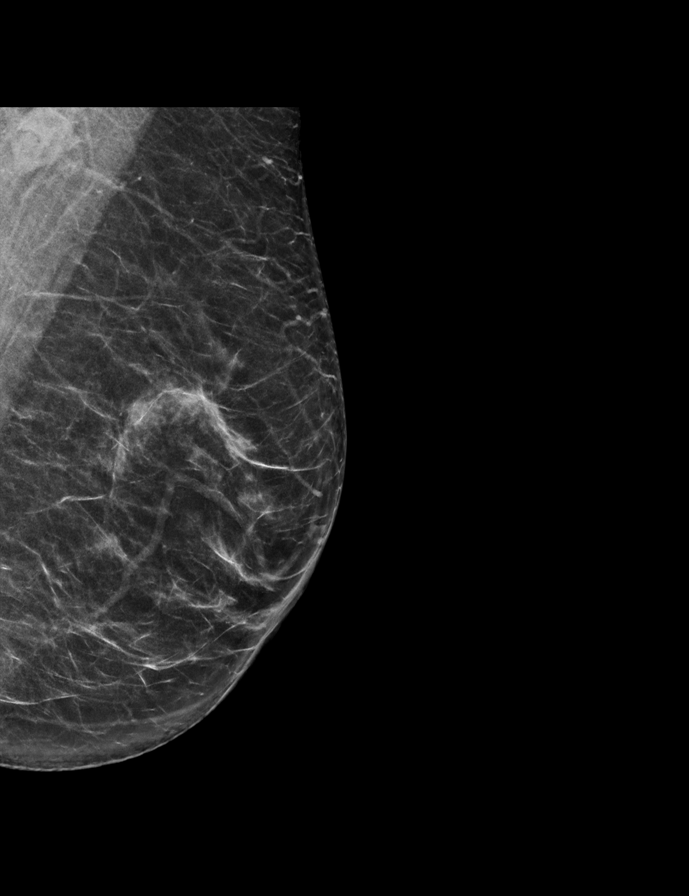

[L CC tomo · 2 of 63 frames shown]
[frame 21/63]
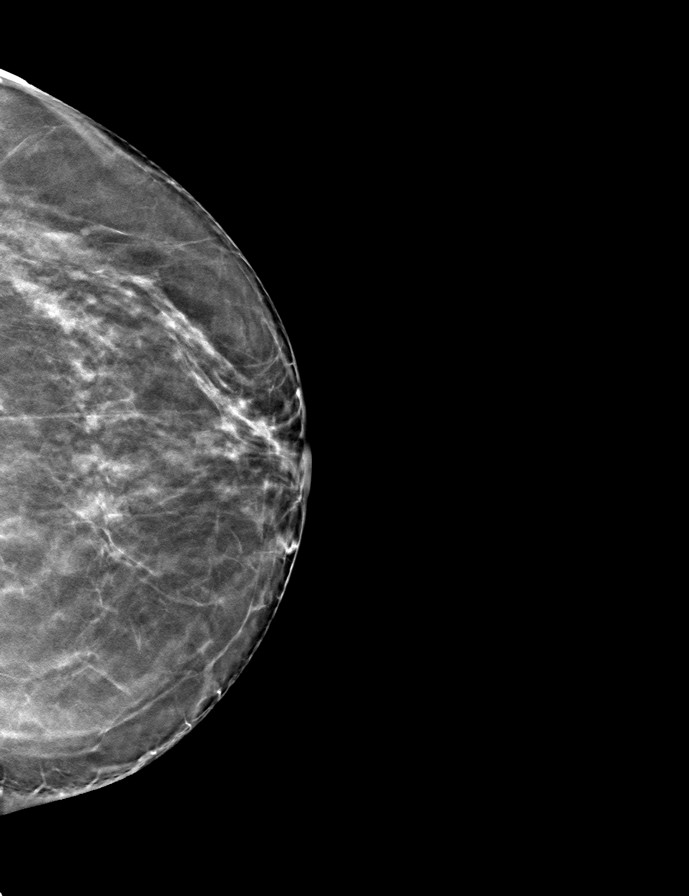
[frame 32/63]
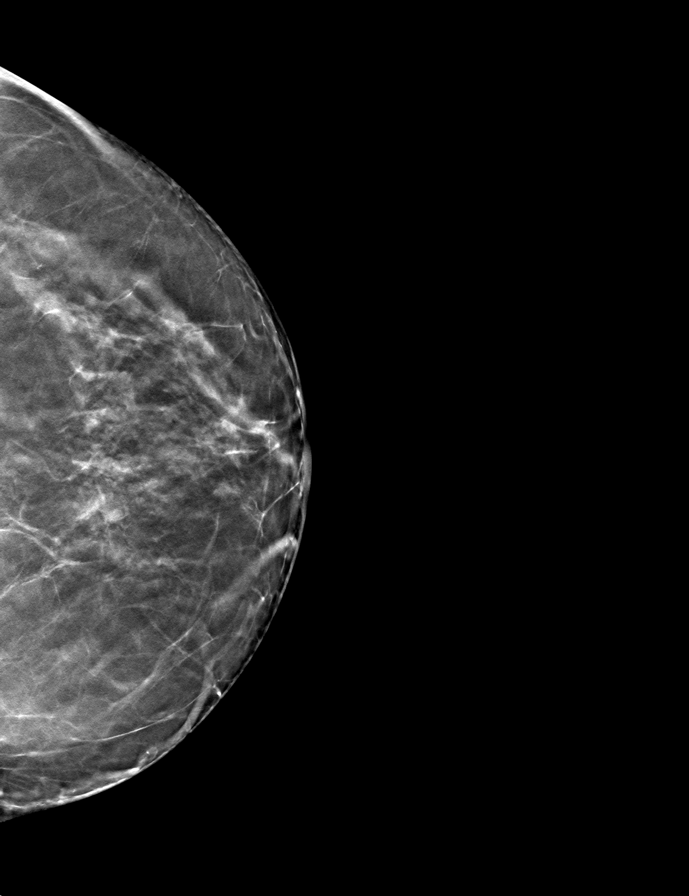

[L MLO tomo · tomo slice 35/68.0]
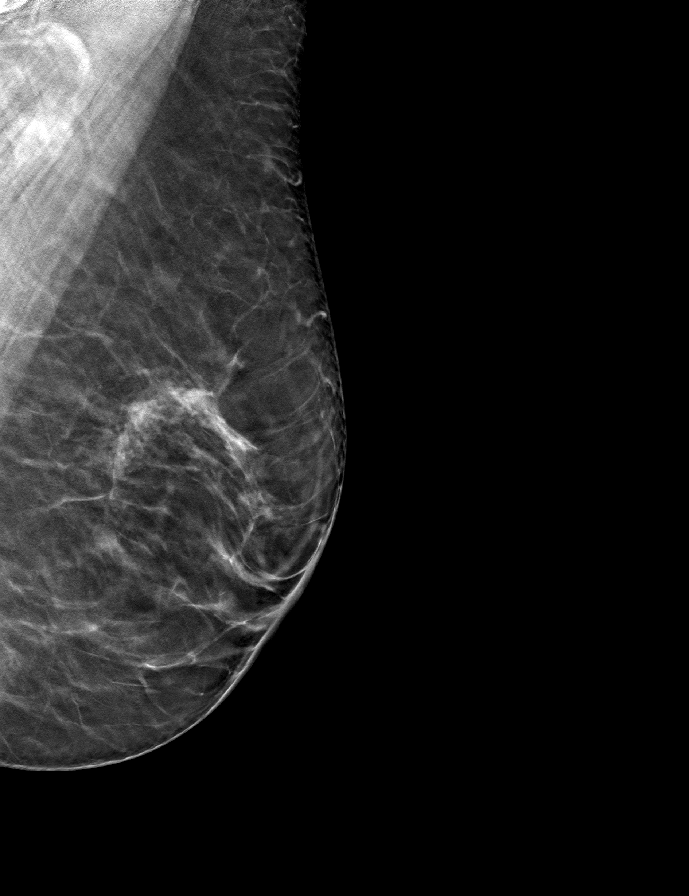

[R CC tomo · tomo slice 35/70.0]
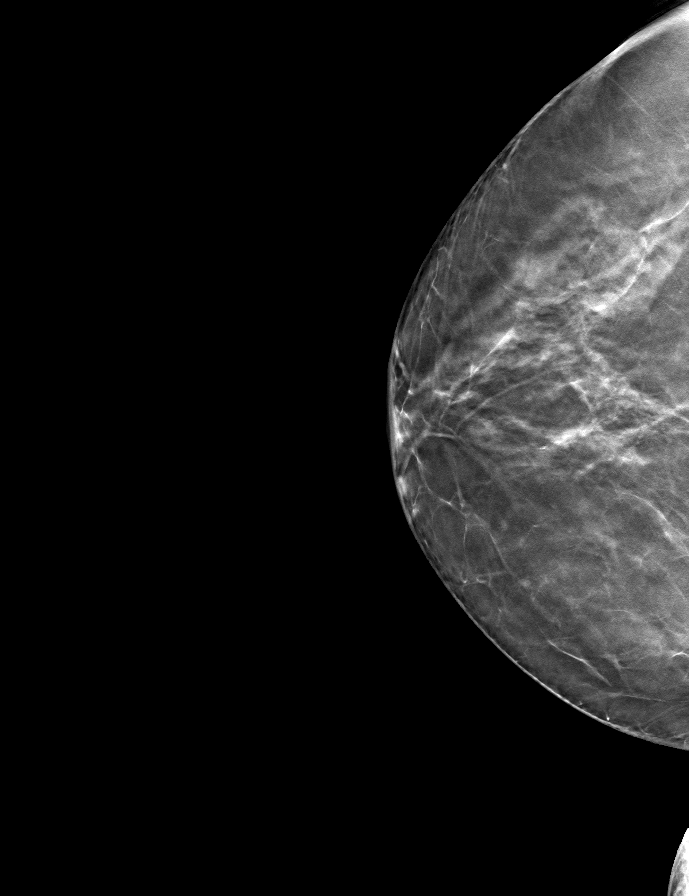

[R MLO tomo · tomo slice 33/65.0]
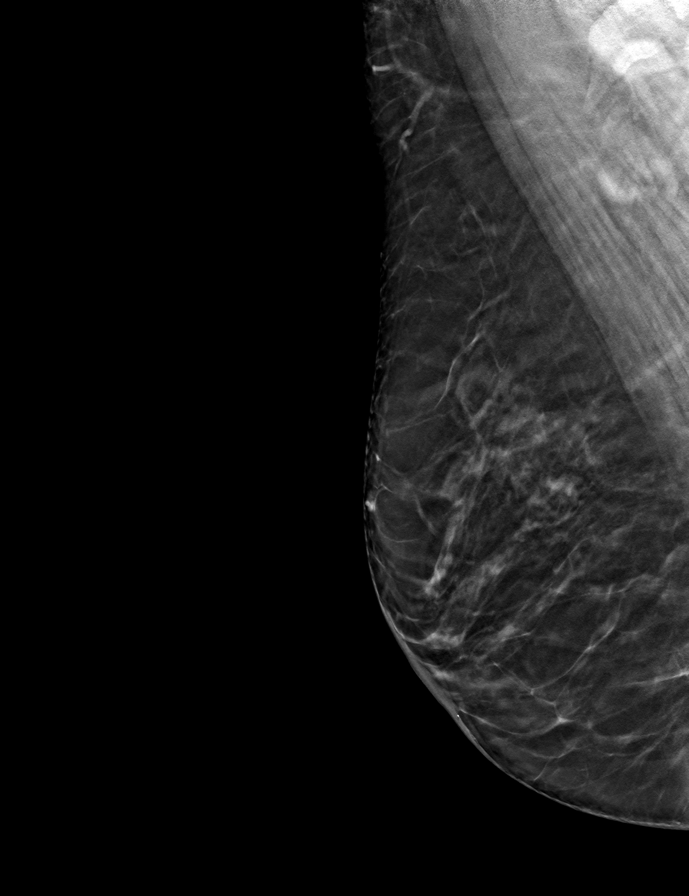

[9 of 24 positions shown; findings below may reference images not displayed]

ACR Breast Density Category c: The breast tissue is heterogeneously
dense, which may obscure small masses.
FINDINGS: There are no findings suspicious for malignancy.
IMPRESSION: No mammographic evidence of malignancy. A result letter of this
screening mammogram will be mailed directly to the patient.

RECOMMENDATION:
Screening mammogram in one year. (Code:Q3-W-BC3)

BI-RADS CATEGORY  1: Negative.

## 2023-02-07 ENCOUNTER — Encounter: Payer: Self-pay | Admitting: Cardiology

## 2023-02-07 ENCOUNTER — Ambulatory Visit: Payer: BC Managed Care – PPO | Admitting: Cardiology

## 2023-02-07 VITALS — BP 122/63 | HR 62 | Ht 63.0 in | Wt 155.6 lb

## 2023-02-07 DIAGNOSIS — E7800 Pure hypercholesterolemia, unspecified: Secondary | ICD-10-CM

## 2023-02-07 DIAGNOSIS — I10 Essential (primary) hypertension: Secondary | ICD-10-CM | POA: Diagnosis not present

## 2023-02-07 DIAGNOSIS — Z713 Dietary counseling and surveillance: Secondary | ICD-10-CM | POA: Diagnosis not present

## 2023-02-07 DIAGNOSIS — I2510 Atherosclerotic heart disease of native coronary artery without angina pectoris: Secondary | ICD-10-CM

## 2023-02-07 DIAGNOSIS — R072 Precordial pain: Secondary | ICD-10-CM

## 2023-02-07 DIAGNOSIS — Z8249 Family history of ischemic heart disease and other diseases of the circulatory system: Secondary | ICD-10-CM

## 2023-02-07 DIAGNOSIS — Z87891 Personal history of nicotine dependence: Secondary | ICD-10-CM

## 2023-02-07 MED ORDER — AMLODIPINE BESYLATE 5 MG PO TABS: 5.0000 mg | ORAL_TABLET | Freq: Every day | ORAL | 3 refills | Status: DC

## 2023-02-07 MED ORDER — METOPROLOL TARTRATE 25 MG PO TABS: 12.5000 mg | ORAL_TABLET | Freq: Two times a day (BID) | ORAL | 3 refills | Status: DC

## 2023-02-07 NOTE — Progress Notes (Signed)
ID:  Thresa Roda Restivo, DOB 08/18/1966, MRN [id number]  PCP:  Merri Brunette, MD  Cardiologist:  Tessa Lerner, DO, [facility] (established care 02/17/2021) Former Cardiology Providers: None  Date: 02/07/23 Last Office Visit: 12/27/2022  Chief Complaint  Patient presents with   Calcification of native coronary artery   Follow-up    HPI  Robin Bowers is a 57 y.o. female whose past medical history and cardiovascular risk factors include: Severe coronary artery calcification, bilateral carotid artery atherosclerosis, hypercholesterolemia, family history of heart disease, insomnia, postmenopausal, former smoker.  She has known history of severe coronary artery calcification and had undergone echo and stress test in the past but due to symptoms of chest pressure came to the office in April 2024 for evaluation.  At last office visit patient states that she would notice substernal chest pressure when she would shovel horse manure and the discomfort would improve with resting.  She was also diagnosed with hypertension recently and started on pharmacological therapy.  At the last office visit we discussed proceeding forward with additional ischemic workup versus up titration of antihypertensive medications.  She preferred the latter and now presents for follow-up.    Since last office visit she has not had any reoccurrence of chest pain; however, she is avoiding overexertion.  Home blood pressures have been running between 134-152 mmHg (she brought a very short list of blood pressure readings).  She has requested my assistance with blood pressure management in the past.  Given her carotid artery disease and she is following with vascular vein specialist.  Mom and maternal grandmother both had heart disease around the age of 3.   FUNCTIONAL STATUS: Functional status is limited secondary to recent left knee replacement as of December 2021.  ALLERGIES: No Known Allergies  MEDICATION  LIST PRIOR TO VISIT: Current Meds  Medication Sig   amLODipine (NORVASC) 5 MG tablet Take 1 tablet (5 mg total) by mouth daily.   aspirin EC 81 MG tablet Take 81 mg by mouth daily. Swallow whole.   hydrochlorothiazide (HYDRODIURIL) 25 MG tablet Take 1 tablet (25 mg total) by mouth every morning.   losartan (COZAAR) 50 MG tablet Take 1 tablet (50 mg total) by mouth daily at 10 pm.   metoprolol tartrate (LOPRESSOR) 25 MG tablet Take 0.5 tablets (12.5 mg total) by mouth 2 (two) times daily for 14 days. Start 1 week before CT Scan   Multiple Vitamin (MULTIVITAMIN ADULT) TABS Take 1 tablet by mouth daily at 12 noon.   rosuvastatin (CRESTOR) 20 MG tablet Take 20 mg by mouth daily.     PAST MEDICAL HISTORY: Past Medical History:  Diagnosis Date   Carotid artery plaque, bilateral    Coronary artery disease due to calcified coronary lesion    High cholesterol    Lyme disease     PAST SURGICAL HISTORY: Past Surgical History:  Procedure Laterality Date   CESAREAN SECTION     x2   COLONOSCOPY     TOTAL KNEE ARTHROPLASTY Left 08/28/2020   Procedure: LEFT TOTAL KNEE ARTHROPLASTY;  Surgeon: Nadara Mustard, MD;  Location: [facility] OR;  Service: Orthopedics;  Laterality: Left;   TUBAL LIGATION     WISDOM TOOTH EXTRACTION      FAMILY HISTORY: The patient family history includes Alzheimer's disease in her maternal grandfather, maternal grandmother, and mother; Breast cancer in her maternal grandmother; Cancer in her father; Heart disease in her maternal grandmother and mother; Hypertension in her mother; Stroke (age of onset:  65) in her mother.  SOCIAL HISTORY:  The patient  reports that she quit smoking about 3 years ago. Her smoking use included cigarettes. She has a 10.00 pack-year smoking history. She has never been exposed to tobacco smoke. She has never used smokeless tobacco. She reports that she does not currently use alcohol. She reports that she does not use drugs.  REVIEW OF SYSTEMS: Review  of Systems  Constitutional: Negative for chills and fever.  HENT:  Negative for hoarse voice and nosebleeds.   Eyes:  Negative for discharge, double vision and pain.  Cardiovascular:  Positive for chest pain (see HPI). Negative for claudication, dyspnea on exertion, leg swelling, near-syncope, orthopnea, palpitations, paroxysmal nocturnal dyspnea and syncope.  Respiratory:  Negative for hemoptysis and shortness of breath.   Musculoskeletal:  Positive for back pain. Negative for muscle cramps and myalgias.  Gastrointestinal:  Negative for abdominal pain, constipation, diarrhea, hematemesis, hematochezia, melena, nausea and vomiting.  Neurological:  Negative for dizziness and light-headedness.    PHYSICAL EXAM:    02/07/2023    1:36 PM 12/27/2022   11:39 AM 11/16/2022    8:04 AM  Vitals with BMI  Height 5\' 3"  5\' 3"    Weight 155 lbs 10 oz 156 lbs 3 oz   BMI 27.57 27.68   Systolic 122 152 161  Diastolic 63 69 76  Pulse 62 64    Physical Exam  Constitutional: No distress.  Age appropriate, hemodynamically stable.   Neck: No JVD present.  Cardiovascular: Normal rate, regular rhythm, S1 normal, S2 normal, intact distal pulses and normal pulses. Exam reveals no gallop, no S3 and no S4.  No murmur heard. Pulmonary/Chest: Effort normal and breath sounds normal. No stridor. She has no wheezes. She has no rales.  Abdominal: Soft. Bowel sounds are normal. She exhibits no distension. There is no abdominal tenderness.  Musculoskeletal:        General: No edema.     Cervical back: Neck supple.  Neurological: She is alert and oriented to person, place, and time. She has intact cranial nerves (2-12).  Skin: Skin is warm and moist.   CARDIAC DATABASE: EKG: 12/27/2022: Sinus rhythm, 60 bpm, normal axis, without underlying ischemia injury pattern.  Echocardiogram: 02/22/2021:  Normal LV systolic function with visual EF 60-65%. Left ventricle cavity is normal in size. Mild left ventricular  hypertrophy. Normal global wall motion. Normal diastolic filling pattern, normal LAP.  Mild tricuspid regurgitation. No evidence of pulmonary hypertension.  No prior study for comparison.  Stress Testing: Lexiscan Tetrofosmin stress test 03/08/2021: Lexiscan nuclear stress test performed using 1-day protocol. Normal myocardial perfusion. Stress LVEF 66%. Low risk study.  Coronary artery calcium scoring: 01/26/2021 Left main: 0. LAD 196. LCx: 0. RCA 220 Total coronary calcium score 416, 99 percentile for same sex, age, and ethnicity matched cohorts  Heart Catheterization: None  Carotid duplex: 03/05/2021: Right Carotid: Velocities in the right ICA are consistent with a 40-59% stenosis. The ECA appears >50% stenosed.  Left Carotid: Velocities in the left ICA are consistent with a 1-39% stenosis.  Vertebrals:  Bilateral vertebral arteries demonstrate antegrade flow.  Subclavians: Normal flow hemodynamics were seen in bilateral subclavian arteries.   11/16/2022: Right Carotid: Velocities in the right ICA are consistent with a 1-39%  stenosis.   Left Carotid: Velocities in the left ICA are consistent with a 1-39%  stenosis.   Vertebrals: Bilateral vertebral arteries demonstrate antegrade flow.  Subclavians: Normal flow hemodynamics were seen in bilateral subclavian  arteries.   LABORATORY DATA:    Latest Ref Rng & Units 08/26/2020    9:39 AM 03/14/2018    9:41 AM 10/02/2013    9:13 AM  CBC  WBC 4.0 - 10.5 K/uL 6.3  14.1  9.2   Hemoglobin 12.0 - 15.0 g/dL 16.1  09.6  04.5   Hematocrit 36.0 - 46.0 % 42.9  41.3  42.7   Platelets 150 - 400 K/uL 251  219  214        Latest Ref Rng & Units 01/03/2023    8:18 AM 03/14/2018    9:41 AM 10/02/2013    9:13 AM  CMP  Glucose 70 - 99 mg/dL 409  811  89   BUN 6 - 24 mg/dL 13  11    Creatinine 9.14 - 1.00 mg/dL 7.82  9.56    Sodium 213 - 144 mmol/L 134  140    Potassium 3.5 - 5.2 mmol/L 4.4  4.5    Chloride 96 - 106  mmol/L 93  105    CO2 20 - 29 mmol/L 27  26    Calcium 8.7 - 10.2 mg/dL 08.6  9.4    Total Protein 6.1 - 8.1 g/dL  6.8    Total Bilirubin 0.2 - 1.2 mg/dL  0.6    AST 10 - 35 U/L  15    ALT 6 - 29 U/L  13      Lipid Panel     Component Value Date/Time   CHOL 241 (H) 03/14/2018 0941   TRIG 130 03/14/2018 0941   HDL 54 03/14/2018 0941   CHOLHDL 4.5 03/14/2018 0941   VLDL 18 07/20/2012 0843   LDLCALC 162 (H) 03/14/2018 0941    No components found for: "NTPROBNP" No results for input(s): "PROBNP" in the last 8760 hours. No results for input(s): "TSH" in the last 8760 hours.  BMP Recent Labs    01/03/23 0818  NA 134  K 4.4  CL 93*  CO2 27  GLUCOSE 115*  BUN 13  CREATININE 0.79  CALCIUM 10.0    HEMOGLOBIN A1C No results found for: "HGBA1C", "MPG"   External Labs:  Date Collected: 01/05/2021 , information obtained by PCP Potassium: 5.2 Creatinine 0.73 mg/dL. eGFR: 97 mL/min per 1.73 m Hemoglobin: 13.2 g/dL and hematocrit: 57.8 % AST: 17 , ALT: 14 , alkaline phosphatase: 108  Lipid profile: Collected 01/05/2021 Total cholesterol 206, triglycerides 203, HDL 54, LDL calculated 117 AST 17, ALT 14, alkaline phosphatase 108.  External Labs: Collected: 12/13/2022 provided by the patient. A1c 5.9. Total cholesterol 142, triglycerides 110, HDL 61, LDL calculated 59, non-HDL 81 hemoglobin 13.6, hematocrit 41.3%.  IMPRESSION:    ICD-10-CM   1. Precordial pain  R07.2 metoprolol tartrate (LOPRESSOR) 25 MG tablet    CMP14+EGFR    CT CORONARY MORPH W/CTA COR W/SCORE W/CA W/CM &/OR WO/CM    2. Calcification of native coronary artery  I25.10 Lipid Panel With LDL/HDL Ratio   I25.84 LDL cholesterol, direct    CMP14+EGFR    CT CORONARY MORPH W/CTA COR W/SCORE W/CA W/CM &/OR WO/CM    3. Benign hypertension  I10 amLODipine (NORVASC) 5 MG tablet    4. Pure hypercholesterolemia  E78.00     5. Family history of heart disease  Z82.49     6. Former smoker  Z87.891         RECOMMENDATIONS: Dazaria Rosenbauer Ricketts is a 57 y.o. female whose past medical history and cardiac risk factors include: Coronary artery  calcification, bilateral carotid artery atherosclerosis, hypercholesterolemia, family history of heart disease, insomnia, postmenopausal, former smoker.  Calcification of native coronary artery Precordial pain Total CAC 416, 99 percentile Continue aspirin and statin therapy. EKG: Nonischemic. Prior echo and stress test results reviewed. Her precordial discomfort has both cardiac and noncardiac features. She is avoid overexertion to prevent symptoms onset.  Shared decision is to proceed w/ CCTA to evaluate for obstructive disease.  Start Lopressor 12.5mg  po bid 1 week before CCTA.  Will continue to improve her modifiable cardiovascular risk factors. However, if the symptoms increase in intensity frequency or duration she is asked to go to the closest ER via EMS for evaluation and management.  Benign hypertension Office blood pressures are well-controlled. However the few blood pressure readings history provide they are in acceptable limits but not at goal.  Start amlodipine 5 mg p.o. every morning Continue hydrochlorothiazide in the morning and losartan at night. Labs in 1 week to evaluate kidney function and electrolytes Reemphasized importance of low-salt diet.  Bilateral carotid artery stenosis Most recent carotid duplex notes improvement in overall disease burden. Currently follows with vascular and vein specialists  Pure hypercholesterolemia Indexed LDL level 162 mg/dL. Most recent LDL levels 59 mg/dL Continue current statin therapy. Will check fasting lipid profile and direct LDL prior to next visit Does not endorse myalgias.  FINAL MEDICATION LIST END OF ENCOUNTER: Meds ordered this encounter  Medications   amLODipine (NORVASC) 5 MG tablet    Sig: Take 1 tablet (5 mg total) by mouth daily.    Dispense:  180 tablet    Refill:  3    metoprolol tartrate (LOPRESSOR) 25 MG tablet    Sig: Take 0.5 tablets (12.5 mg total) by mouth 2 (two) times daily for 14 days. Start 1 week before CT Scan    Dispense:  180 tablet    Refill:  3    There are no discontinued medications.   Current Outpatient Medications:    amLODipine (NORVASC) 5 MG tablet, Take 1 tablet (5 mg total) by mouth daily., Disp: 180 tablet, Rfl: 3   aspirin EC 81 MG tablet, Take 81 mg by mouth daily. Swallow whole., Disp: , Rfl:    hydrochlorothiazide (HYDRODIURIL) 25 MG tablet, Take 1 tablet (25 mg total) by mouth every morning., Disp: 90 tablet, Rfl: 0   losartan (COZAAR) 50 MG tablet, Take 1 tablet (50 mg total) by mouth daily at 10 pm., Disp: 30 tablet, Rfl: 0   metoprolol tartrate (LOPRESSOR) 25 MG tablet, Take 0.5 tablets (12.5 mg total) by mouth 2 (two) times daily for 14 days. Start 1 week before CT Scan, Disp: 180 tablet, Rfl: 3   Multiple Vitamin (MULTIVITAMIN ADULT) TABS, Take 1 tablet by mouth daily at 12 noon., Disp: , Rfl:    rosuvastatin (CRESTOR) 20 MG tablet, Take 20 mg by mouth daily., Disp: , Rfl:   Orders Placed This Encounter  Procedures   CT CORONARY MORPH W/CTA COR W/SCORE W/CA W/CM &/OR WO/CM   Lipid Panel With LDL/HDL Ratio   LDL cholesterol, direct   [id number]   There are no Patient Instructions on file for this visit.   --Continue cardiac medications as reconciled in final medication list. --Return in about 4 weeks (around 03/07/2023) for Follow-up after coronary CTA. Or sooner if needed. --Continue follow-up with your primary care physician regarding the management of your other chronic comorbid conditions.  Patient's questions and concerns were addressed to her satisfaction. She voices understanding of the instructions provided  during this encounter.   This note was created using a voice recognition software as a result there may be grammatical errors inadvertently enclosed that do not reflect the nature of this encounter. Every  attempt is made to correct such errors.  Tessa Lerner, [place], [facility]  Pager:  [phone] Office: [phone]

## 2023-02-10 DIAGNOSIS — Z6826 Body mass index (BMI) 26.0-26.9, adult: Secondary | ICD-10-CM | POA: Diagnosis not present

## 2023-02-10 DIAGNOSIS — Z713 Dietary counseling and surveillance: Secondary | ICD-10-CM | POA: Diagnosis not present

## 2023-02-15 DIAGNOSIS — R072 Precordial pain: Secondary | ICD-10-CM | POA: Diagnosis not present

## 2023-02-15 DIAGNOSIS — I2584 Coronary atherosclerosis due to calcified coronary lesion: Secondary | ICD-10-CM | POA: Diagnosis not present

## 2023-02-15 DIAGNOSIS — I2510 Atherosclerotic heart disease of native coronary artery without angina pectoris: Secondary | ICD-10-CM | POA: Diagnosis not present

## 2023-02-16 ENCOUNTER — Other Ambulatory Visit: Payer: Self-pay | Admitting: Cardiology

## 2023-02-16 DIAGNOSIS — I10 Essential (primary) hypertension: Secondary | ICD-10-CM

## 2023-02-16 LAB — LIPID PANEL WITH LDL/HDL RATIO
Cholesterol, Total: 151 mg/dL (ref 100–199)
HDL: 67 mg/dL (ref >39)
LDL Chol Calc (NIH): 68 mg/dL (ref 0–99)
LDL/HDL Ratio: 1 ratio (ref 0.0–3.2)
Triglycerides: 87 mg/dL (ref 0–149)
VLDL Cholesterol Cal: 16 mg/dL (ref 5–40)

## 2023-02-16 LAB — CMP14+EGFR
ALT: 19 IU/L (ref 0–32)
AST: 20 IU/L (ref 0–40)
Albumin/Globulin Ratio: 1.8 (ref 1.2–2.2)
Albumin: 4.5 g/dL (ref 3.8–4.9)
Alkaline Phosphatase: 104 IU/L (ref 44–121)
BUN/Creatinine Ratio: 15 (ref 9–23)
BUN: 13 mg/dL (ref 6–24)
Bilirubin Total: 0.5 mg/dL (ref 0.0–1.2)
CO2: 28 mmol/L (ref 20–29)
Calcium: 10.1 mg/dL (ref 8.7–10.2)
Chloride: 94 mmol/L — ABNORMAL LOW (ref 96–106)
Creatinine, Ser: 0.86 mg/dL (ref 0.57–1.00)
Globulin, Total: 2.5 g/dL (ref 1.5–4.5)
Glucose: 113 mg/dL — ABNORMAL HIGH (ref 70–99)
Potassium: 3.7 mmol/L (ref 3.5–5.2)
Sodium: 137 mmol/L (ref 134–144)
Total Protein: 7 g/dL (ref 6.0–8.5)
eGFR: 78 mL/min/{1.73_m2} (ref >59)

## 2023-02-16 LAB — LDL CHOLESTEROL, DIRECT: LDL Direct: 58 mg/dL (ref 0–99)

## 2023-02-17 NOTE — Progress Notes (Signed)
Called patient to inform her about her lab result. Patient understood

## 2023-02-20 ENCOUNTER — Telehealth (HOSPITAL_COMMUNITY): Payer: Self-pay | Admitting: *Deleted

## 2023-02-20 ENCOUNTER — Ambulatory Visit: Payer: BC Managed Care – PPO | Admitting: Cardiology

## 2023-02-20 NOTE — Telephone Encounter (Signed)
Reaching out to patient to offer assistance regarding upcoming cardiac imaging study; pt verbalizes understanding of appt date/time, parking situation and where to check in, pre-test NPO status and medications ordered, and verified current allergies; name and call back number provided for further questions should they arise  Larey Brick RN Navigator Cardiac Imaging [facility] [phone] office [phone] cell  Patient to take 12.5mg  metoprolol tartrate two hours prior to his cardiac CT scan. She is aware to arrive at 8am.

## 2023-02-21 ENCOUNTER — Ambulatory Visit (HOSPITAL_COMMUNITY)
Admission: RE | Admit: 2023-02-21 | Discharge: 2023-02-21 | Disposition: A | Discharge status: Home / Self Care | Payer: BC Managed Care – PPO | Source: Ambulatory Visit | Attending: Internal Medicine | Admitting: Internal Medicine

## 2023-02-21 DIAGNOSIS — R931 Abnormal findings on diagnostic imaging of heart and coronary circulation: Secondary | ICD-10-CM | POA: Insufficient documentation

## 2023-02-21 DIAGNOSIS — I2584 Coronary atherosclerosis due to calcified coronary lesion: Secondary | ICD-10-CM | POA: Diagnosis not present

## 2023-02-21 DIAGNOSIS — I2510 Atherosclerotic heart disease of native coronary artery without angina pectoris: Secondary | ICD-10-CM

## 2023-02-21 DIAGNOSIS — R072 Precordial pain: Secondary | ICD-10-CM | POA: Insufficient documentation

## 2023-02-21 MED ORDER — IOHEXOL 350 MG/ML SOLN
95.0000 mL | Freq: Once | INTRAVENOUS | Status: AC | PRN
  Administered 2023-02-21: 95 mL via INTRAVENOUS

## 2023-02-21 MED ORDER — NITROGLYCERIN 0.4 MG SL SUBL
SUBLINGUAL_TABLET | SUBLINGUAL | Status: AC
  Filled 2023-02-21: qty 2

## 2023-02-21 MED ORDER — NITROGLYCERIN 0.4 MG SL SUBL
0.8000 mg | SUBLINGUAL_TABLET | Freq: Once | SUBLINGUAL | Status: AC
  Administered 2023-02-21: 0.8 mg via SUBLINGUAL

## 2023-02-23 ENCOUNTER — Other Ambulatory Visit: Payer: Self-pay | Admitting: Cardiology

## 2023-02-23 ENCOUNTER — Ambulatory Visit (HOSPITAL_COMMUNITY)
Admission: RE | Admit: 2023-02-23 | Discharge: 2023-02-23 | Disposition: A | Discharge status: Home / Self Care | Payer: BC Managed Care – PPO | Source: Ambulatory Visit | Attending: Cardiology | Admitting: Cardiology

## 2023-02-23 DIAGNOSIS — I2510 Atherosclerotic heart disease of native coronary artery without angina pectoris: Secondary | ICD-10-CM | POA: Diagnosis not present

## 2023-02-23 DIAGNOSIS — R072 Precordial pain: Secondary | ICD-10-CM | POA: Insufficient documentation

## 2023-02-23 DIAGNOSIS — R931 Abnormal findings on diagnostic imaging of heart and coronary circulation: Secondary | ICD-10-CM | POA: Diagnosis not present

## 2023-02-23 DIAGNOSIS — I2584 Coronary atherosclerosis due to calcified coronary lesion: Secondary | ICD-10-CM | POA: Diagnosis not present

## 2023-02-28 ENCOUNTER — Encounter: Payer: Self-pay | Admitting: Cardiology

## 2023-02-28 ENCOUNTER — Ambulatory Visit: Payer: BC Managed Care – PPO | Admitting: Cardiology

## 2023-02-28 VITALS — BP 110/57 | HR 52 | Resp 16 | Ht 63.0 in | Wt 154.4 lb

## 2023-02-28 DIAGNOSIS — I6523 Occlusion and stenosis of bilateral carotid arteries: Secondary | ICD-10-CM

## 2023-02-28 DIAGNOSIS — I10 Essential (primary) hypertension: Secondary | ICD-10-CM

## 2023-02-28 DIAGNOSIS — Z87891 Personal history of nicotine dependence: Secondary | ICD-10-CM

## 2023-02-28 DIAGNOSIS — I2510 Atherosclerotic heart disease of native coronary artery without angina pectoris: Secondary | ICD-10-CM

## 2023-02-28 DIAGNOSIS — E7800 Pure hypercholesterolemia, unspecified: Secondary | ICD-10-CM | POA: Diagnosis not present

## 2023-02-28 DIAGNOSIS — Z8249 Family history of ischemic heart disease and other diseases of the circulatory system: Secondary | ICD-10-CM

## 2023-02-28 MED ORDER — EZETIMIBE 10 MG PO TABS: 10.0000 mg | ORAL_TABLET | Freq: Every day | ORAL | 3 refills | Status: DC

## 2023-02-28 NOTE — Progress Notes (Signed)
ID:  Robin Bowers, DOB 05/10/1966, MRN [id number]  PCP:  Ladora Daniel, PA-C  Cardiologist:  Tessa Lerner, DO, [facility] (established care 02/17/2021) Former Cardiology Providers: None  Date: 02/28/23 Last Office Visit: 02/07/2023  Chief Complaint  Patient presents with   Chest Pain   Follow-up   Results    HPI  Robin Bowers is a 57 y.o. female whose past medical history and cardiovascular risk factors include: Severe coronary artery calcification, bilateral carotid artery atherosclerosis, hypercholesterolemia, family history of heart disease, insomnia, postmenopausal, former smoker.  She has known history of coronary artery calcification dating back to 2022 and did undergo echo and stress test in the past due to symptoms of chest pressure and comes back to the office back in April 2024 for reevaluation.  At the last office visit the shared decision was to proceed with coronary CTA to evaluate for obstructive CAD.  Patient's "total coronary calcium score has trended up compared to prior study and she has Cads RADS 3 disease.  CT FFR was performed which showed no significant stenosis however mid to distal RCA segment is in the intermediate range.  Patient was called to the office sooner than her scheduled appointment to discuss her symptoms and test results.  Since last office visit patient has increased her physical activity by walking more frequently and has not had any reoccurrence of anginal chest pain or heart failure symptoms.  She is compliant with her current medical therapy.  At the last office visit she was recommended to start amlodipine 5 mg p.o. daily.  In addition to hydrochlorothiazide and losartan.  Her blood pressure today is also well-controlled.  FUNCTIONAL STATUS: Functional status is limited secondary to recent left knee replacement as of December 2021.  ALLERGIES: No Known Allergies  MEDICATION LIST PRIOR TO VISIT: Current Meds  Medication Sig    amLODipine (NORVASC) 5 MG tablet Take 1 tablet (5 mg total) by mouth daily.   aspirin EC 81 MG tablet Take 81 mg by mouth daily. Swallow whole.   ezetimibe (ZETIA) 10 MG tablet Take 1 tablet (10 mg total) by mouth daily.   hydrochlorothiazide (HYDRODIURIL) 25 MG tablet Take 1 tablet (25 mg total) by mouth every morning.   losartan (COZAAR) 50 MG tablet TAKE 1 TABLET BY MOUTH ONCE DAILY AT 10 PM   Multiple Vitamin (MULTIVITAMIN ADULT) TABS Take 1 tablet by mouth daily at 12 noon.   rosuvastatin (CRESTOR) 20 MG tablet Take 20 mg by mouth daily.     PAST MEDICAL HISTORY: Past Medical History:  Diagnosis Date   Carotid artery plaque, bilateral    Coronary artery disease due to calcified coronary lesion    High cholesterol    Lyme disease     PAST SURGICAL HISTORY: Past Surgical History:  Procedure Laterality Date   CESAREAN SECTION     x2   COLONOSCOPY     TOTAL KNEE ARTHROPLASTY Left 08/28/2020   Procedure: LEFT TOTAL KNEE ARTHROPLASTY;  Surgeon: Nadara Mustard, MD;  Location: [facility] OR;  Service: Orthopedics;  Laterality: Left;   TUBAL LIGATION     WISDOM TOOTH EXTRACTION      FAMILY HISTORY: The patient family history includes Alzheimer's disease in her maternal grandfather, maternal grandmother, and mother; Breast cancer in her maternal grandmother; Cancer in her father; Heart disease in her maternal grandmother and mother; Hypertension in her mother; Stroke (age of onset: 68) in her mother.  SOCIAL HISTORY:  The patient  reports that she  quit smoking about 3 years ago. Her smoking use included cigarettes. She has a 10.00 pack-year smoking history. She has never been exposed to tobacco smoke. She has never used smokeless tobacco. She reports that she does not currently use alcohol. She reports that she does not use drugs.  REVIEW OF SYSTEMS: Review of Systems  Cardiovascular:  Negative for chest pain, claudication, dyspnea on exertion, irregular heartbeat, leg swelling,  near-syncope, orthopnea, palpitations, paroxysmal nocturnal dyspnea and syncope.  Respiratory:  Negative for shortness of breath.   Hematologic/Lymphatic: Negative for bleeding problem.  Musculoskeletal:  Negative for muscle cramps and myalgias.  Neurological:  Negative for dizziness and light-headedness.    PHYSICAL EXAM:    02/28/2023    9:53 AM 02/21/2023    8:25 AM 02/21/2023    8:02 AM  Vitals with BMI  Height 5\' 3"     Weight 154 lbs 6 oz    BMI 27.36    Systolic 110 124 161  Diastolic 57 61 54  Pulse 52 57    Physical Exam  Constitutional: No distress.  Age appropriate, hemodynamically stable.   Neck: No JVD present.  Cardiovascular: Normal rate, regular rhythm, S1 normal, S2 normal, intact distal pulses and normal pulses. Exam reveals no gallop, no S3 and no S4.  No murmur heard. Pulmonary/Chest: Effort normal and breath sounds normal. No stridor. She has no wheezes. She has no rales.  Abdominal: Soft. Bowel sounds are normal. She exhibits no distension. There is no abdominal tenderness.  Musculoskeletal:        General: No edema.     Cervical back: Neck supple.  Neurological: She is alert and oriented to person, place, and time. She has intact cranial nerves (2-12).  Skin: Skin is warm and moist.   CARDIAC DATABASE: EKG: 12/27/2022: Sinus rhythm, 60 bpm, normal axis, without underlying ischemia injury pattern.  Echocardiogram: 02/22/2021:  Normal LV systolic function with visual EF 60-65%. Left ventricle cavity is normal in size. Mild left ventricular hypertrophy. Normal global wall motion. Normal diastolic filling pattern, normal LAP.  Mild tricuspid regurgitation. No evidence of pulmonary hypertension.  No prior study for comparison.  Stress Testing: Lexiscan Tetrofosmin stress test 03/08/2021: Lexiscan nuclear stress test performed using 1-day protocol. Normal myocardial perfusion. Stress LVEF 66%. Low risk study.  Coronary artery calcium  scoring: 01/26/2021 Left main: 0. LAD 196. LCx: 0. RCA 220 Total coronary calcium score 416, 99 percentile for same sex, age, and ethnicity matched cohorts  Heart Catheterization: None  CCTA  02/21/2023 1. Total coronary calcium score of 572. This was 99th percentile for age and sex matched control.   2. Normal coronary origin with right dominance.   3. CAD-RADS = 3.   Left Main: Mild (25-49%) stenosis at distal segment due to calcified plaque and extends to ostial LAD.  LAD: Mild (25-49%) stenosis at ostial segment due to calcified plaque. Moderate (50-69%) proximal / mid segment due to mixed (but predominantly calcified) plaque.   LCX: Minimal calcified plaque within proximal/mid LCx otherwise patent.   RCA: Mild (25-49%) stenosis at proximal segment due to calcified plaque. Moderate (50-69%) stenosis at proximal / mid segment due to mixed plaque.   4. Study is sent for CT-FFR and findings will be performed and reported separately.  Non-cardiac CT read is still penidng  CTFFR  06'/02/2023 1. Left Main: FFR = 0.99 2. LAD: Proximal FFR = 0.96, mid FFR = 0.92, distal FFR = 0.86 3. LCX: Proximal FFR = 0.98, distal FFR = 0.90  4. RCA: Proximal FFR = 0.98, mid FFR =0.91, distal FFR = 0.80   IMPRESSION: 1. CT FFR analysis showed no significant stenosis; however, CT FFR at mid/distal RCA segment is within the indeterminate zone  Carotid duplex: 03/05/2021: Right Carotid: Velocities in the right ICA are consistent with a 40-59% stenosis. The ECA appears >50% stenosed.  Left Carotid: Velocities in the left ICA are consistent with a 1-39% stenosis.  Vertebrals:  Bilateral vertebral arteries demonstrate antegrade flow.  Subclavians: Normal flow hemodynamics were seen in bilateral subclavian arteries.   11/16/2022: Right Carotid: Velocities in the right ICA are consistent with a 1-39%  stenosis.   Left Carotid: Velocities in the left ICA are consistent with a 1-39%   stenosis.   Vertebrals: Bilateral vertebral arteries demonstrate antegrade flow.  Subclavians: Normal flow hemodynamics were seen in bilateral subclavian               arteries.   LABORATORY DATA:    Latest Ref Rng & Units 08/26/2020    9:39 AM 03/14/2018    9:41 AM 10/02/2013    9:13 AM  CBC  WBC 4.0 - 10.5 K/uL 6.3  14.1  9.2   Hemoglobin 12.0 - 15.0 g/dL 16.1  09.6  04.5   Hematocrit 36.0 - 46.0 % 42.9  41.3  42.7   Platelets 150 - 400 K/uL 251  219  214        Latest Ref Rng & Units 02/15/2023    8:23 AM 01/03/2023    8:18 AM 03/14/2018    9:41 AM  CMP  Glucose 70 - 99 mg/dL 409  811  914   BUN 6 - 24 mg/dL 13  13  11    Creatinine 0.57 - 1.00 mg/dL 7.82  9.56  2.13   Sodium 134 - 144 mmol/L 137  134  140   Potassium 3.5 - 5.2 mmol/L 3.7  4.4  4.5   Chloride 96 - 106 mmol/L 94  93  105   CO2 20 - 29 mmol/L 28  27  26    Calcium 8.7 - 10.2 mg/dL 08.6  57.8  9.4   Total Protein 6.0 - 8.5 g/dL 7.0   6.8   Total Bilirubin 0.0 - 1.2 mg/dL 0.5   0.6   Alkaline Phos 44 - 121 IU/L 104     AST 0 - 40 IU/L 20   15   ALT 0 - 32 IU/L 19   13     Lipid Panel     Component Value Date/Time   CHOL 151 02/15/2023 0823   TRIG 87 02/15/2023 0823   HDL 67 02/15/2023 0823   CHOLHDL 4.5 03/14/2018 0941   VLDL 18 07/20/2012 0843   LDLCALC 68 02/15/2023 0823   LDLCALC 162 (H) 03/14/2018 0941   LDLDIRECT 58 02/15/2023 0823   LABVLDL 16 02/15/2023 0823    No components found for: "NTPROBNP" No results for input(s): "PROBNP" in the last 8760 hours. No results for input(s): "TSH" in the last 8760 hours.  BMP Recent Labs    01/03/23 0818 02/15/23 0823  NA 134 137  K 4.4 3.7  CL 93* 94*  CO2 27 28  GLUCOSE 115* 113*  BUN 13 13  CREATININE 0.79 0.86  CALCIUM 10.0 10.1    HEMOGLOBIN A1C No results found for: "HGBA1C", "MPG"   External Labs:  Date Collected: 01/05/2021 , information obtained by PCP Potassium: 5.2 Creatinine 0.73 mg/dL. eGFR: 97 mL/min per 1.73  m Hemoglobin: 13.2  g/dL and hematocrit: 16.1 % AST: 17 , ALT: 14 , alkaline phosphatase: 108  Lipid profile: Collected 01/05/2021 Total cholesterol 206, triglycerides 203, HDL 54, LDL calculated 117 AST 17, ALT 14, alkaline phosphatase 108.  External Labs: Collected: 12/13/2022 provided by the patient. A1c 5.9. Total cholesterol 142, triglycerides 110, HDL 61, LDL calculated 59, non-HDL 81 hemoglobin 13.6, hematocrit 41.3%.  IMPRESSION:    ICD-10-CM   1. Coronary atherosclerosis due to calcified coronary lesion  I25.10    I25.84     2. Calcification of native coronary artery  I25.10 ezetimibe (ZETIA) 10 MG tablet   I25.84     3. Benign hypertension  I10     4. Pure hypercholesterolemia  E78.00 ezetimibe (ZETIA) 10 MG tablet    Lipid Panel With LDL/HDL Ratio    LDL cholesterol, direct    CMP14+EGFR    5. Family history of heart disease  Z82.49     6. Former smoker  Z87.891     7. Bilateral carotid artery stenosis  I65.23 ezetimibe (ZETIA) 10 MG tablet       RECOMMENDATIONS: Robin Bowers is a 57 y.o. female whose past medical history and cardiac risk factors include: Coronary artery calcification, bilateral carotid artery atherosclerosis, hypercholesterolemia, family history of heart disease, insomnia, postmenopausal, former smoker.  Calcification of native coronary artery Total CAC 572, 99 percentile Continue aspirin and statin therapy. Denies chest pain since last office visit Prior EKG: Nonischemic. CCTA: CAD-RAD3 disease w/ no hemodynamically significant disease.  However, mid to distal RCA is around the intermediate zone. Since last office visit she is increased her physical activity slightly by walking more and she has not had any exertional discomfort anymore. Her blood pressures have improved with medication titration. Given the significant delta change in her coronary calcium score completed 2022 recommend a goal LDL at least <55 mg/dL. Continue  rosuvastatin at the current dose. Will start Zetia 10 mg p.o. daily. Offered prescribing sublingual nitroglycerin tablets to use on a as needed basis; however, patient would like to hold off on nitro prescription. Educated on seeking medical attention sooner by going to the closest ER via EMS if the symptoms increase in intensity, frequency, duration, or has typical chest pain as discussed in the office.  Patient verbalized understanding.  Benign hypertension Office blood pressures are well-controlled. Continue current medical therapy. Started on amlodipine at last office visit and has done well.  Bilateral carotid artery stenosis Most recent carotid duplex notes improvement in overall disease burden. Currently follows with vascular and vein specialists  Pure hypercholesterolemia Indexed LDL level 162 mg/dL. Most recent LDL levels 59 mg/dL Start Zetia for reasons mentioned above. Will repeat fasting lipid profile in 6 weeks to reevaluate therapy Does not endorse myalgias.  Noncardiac portion of the coronary CTA still pending patient is aware we will reach out to them for over read.  FINAL MEDICATION LIST END OF ENCOUNTER: Meds ordered this encounter  Medications   ezetimibe (ZETIA) 10 MG tablet    Sig: Take 1 tablet (10 mg total) by mouth daily.    Dispense:  90 tablet    Refill:  3    Medications Discontinued During This Encounter  Medication Reason   metoprolol tartrate (LOPRESSOR) 25 MG tablet Completed Course     Current Outpatient Medications:    amLODipine (NORVASC) 5 MG tablet, Take 1 tablet (5 mg total) by mouth daily., Disp: 180 tablet, Rfl: 3   aspirin EC 81 MG tablet, Take 81 mg by mouth  daily. Swallow whole., Disp: , Rfl:    ezetimibe (ZETIA) 10 MG tablet, Take 1 tablet (10 mg total) by mouth daily., Disp: 90 tablet, Rfl: 3   hydrochlorothiazide (HYDRODIURIL) 25 MG tablet, Take 1 tablet (25 mg total) by mouth every morning., Disp: 90 tablet, Rfl: 0   losartan  (COZAAR) 50 MG tablet, TAKE 1 TABLET BY MOUTH ONCE DAILY AT 10 PM, Disp: 30 tablet, Rfl: 0   Multiple Vitamin (MULTIVITAMIN ADULT) TABS, Take 1 tablet by mouth daily at 12 noon., Disp: , Rfl:    rosuvastatin (CRESTOR) 20 MG tablet, Take 20 mg by mouth daily., Disp: , Rfl:   Orders Placed This Encounter  Procedures   Lipid Panel With LDL/HDL Ratio   LDL cholesterol, direct   [id number]   There are no Patient Instructions on file for this visit.   --Continue cardiac medications as reconciled in final medication list. --Return in about 7 weeks (around 04/20/2023) for Follow up, CAD. Or sooner if needed. --Continue follow-up with your primary care physician regarding the management of your other chronic comorbid conditions.  Patient's questions and concerns were addressed to her satisfaction. She voices understanding of the instructions provided during this encounter.   This note was created using a voice recognition software as a result there may be grammatical errors inadvertently enclosed that do not reflect the nature of this encounter. Every attempt is made to correct such errors.  Tessa Lerner, [place], [facility]  Pager:  [phone] Office: [phone]

## 2023-03-03 DIAGNOSIS — Z6826 Body mass index (BMI) 26.0-26.9, adult: Secondary | ICD-10-CM | POA: Diagnosis not present

## 2023-03-03 DIAGNOSIS — Z713 Dietary counseling and surveillance: Secondary | ICD-10-CM | POA: Diagnosis not present

## 2023-03-18 ENCOUNTER — Other Ambulatory Visit: Payer: Self-pay | Admitting: Cardiology

## 2023-03-18 DIAGNOSIS — I10 Essential (primary) hypertension: Secondary | ICD-10-CM

## 2023-03-22 ENCOUNTER — Other Ambulatory Visit: Payer: Self-pay | Admitting: Cardiology

## 2023-03-22 DIAGNOSIS — I10 Essential (primary) hypertension: Secondary | ICD-10-CM

## 2023-03-31 DIAGNOSIS — Z6826 Body mass index (BMI) 26.0-26.9, adult: Secondary | ICD-10-CM | POA: Diagnosis not present

## 2023-03-31 DIAGNOSIS — Z713 Dietary counseling and surveillance: Secondary | ICD-10-CM | POA: Diagnosis not present

## 2023-04-05 ENCOUNTER — Other Ambulatory Visit: Payer: Self-pay

## 2023-04-05 DIAGNOSIS — E7800 Pure hypercholesterolemia, unspecified: Secondary | ICD-10-CM

## 2023-04-18 ENCOUNTER — Other Ambulatory Visit: Payer: Self-pay | Admitting: Cardiology

## 2023-04-18 DIAGNOSIS — I10 Essential (primary) hypertension: Secondary | ICD-10-CM

## 2023-04-21 DIAGNOSIS — Z6826 Body mass index (BMI) 26.0-26.9, adult: Secondary | ICD-10-CM | POA: Diagnosis not present

## 2023-04-21 DIAGNOSIS — Z713 Dietary counseling and surveillance: Secondary | ICD-10-CM | POA: Diagnosis not present

## 2023-04-25 DIAGNOSIS — E7800 Pure hypercholesterolemia, unspecified: Secondary | ICD-10-CM | POA: Diagnosis not present

## 2023-05-09 ENCOUNTER — Encounter: Payer: Self-pay | Admitting: Cardiology

## 2023-05-09 ENCOUNTER — Ambulatory Visit: Payer: BC Managed Care – PPO | Admitting: Cardiology

## 2023-05-09 DIAGNOSIS — I6523 Occlusion and stenosis of bilateral carotid arteries: Secondary | ICD-10-CM

## 2023-05-09 DIAGNOSIS — E7800 Pure hypercholesterolemia, unspecified: Secondary | ICD-10-CM | POA: Diagnosis not present

## 2023-05-09 DIAGNOSIS — I2510 Atherosclerotic heart disease of native coronary artery without angina pectoris: Secondary | ICD-10-CM | POA: Diagnosis not present

## 2023-05-09 DIAGNOSIS — I2584 Coronary atherosclerosis due to calcified coronary lesion: Secondary | ICD-10-CM | POA: Diagnosis not present

## 2023-05-09 DIAGNOSIS — I10 Essential (primary) hypertension: Secondary | ICD-10-CM

## 2023-05-09 MED ORDER — AMLODIPINE BESYLATE 5 MG PO TABS: 5.0000 mg | ORAL_TABLET | Freq: Every day | ORAL | 3 refills | Status: DC

## 2023-05-09 MED ORDER — EZETIMIBE 10 MG PO TABS: 10.0000 mg | ORAL_TABLET | Freq: Every day | ORAL | 3 refills | Status: DC

## 2023-05-09 NOTE — Progress Notes (Signed)
ID:  Robin Bowers, DOB [birth year], MRN [id number]  PCP:  Ladora Daniel, PA-C  Cardiologist:  Tessa Lerner, DO, [facility] (established care 02/17/2021) Former Cardiology Providers: None  Date: 05/09/23 Last Office Visit: 02/28/2023  Chief Complaint  Patient presents with   Coronary Artery Disease   Follow-up    HPI  Robin Bowers is a 57 y.o. female whose past medical history and cardiovascular risk factors include: Severe coronary artery calcification, bilateral carotid artery atherosclerosis, hypercholesterolemia, family history of heart disease, insomnia, postmenopausal, former smoker.  She has known history of coronary artery calcification dating back to 2022 and did undergo echo and stress test in the past due to symptoms of chest pressure and comes back to the office back in April 2024 for reevaluation.  Due to continued symptoms she did undergo coronary CTA to evaluate for obstructive disease.  Her coronary calcium score had trended up compared to prior study, she had moderate disease in the LAD and RCA.  CT FFR was performed which showed no significant disease however mid to distal RCA segment was in the intermediate range.  Shared decision at last office visit was to monitor closely for symptoms of chest pain or heart failure and if present invasive angiography would be recommended to reevaluate disease burden plus or minus intervention.  At the last office visit patient was started on Zetia 10 mg p.o. daily and repeat lipids have noted improvement in LDL levels.  Her home blood pressures have also improved on current medical therapy.  FUNCTIONAL STATUS: Functional status is limited secondary to recent left knee replacement as of December 2021.  ALLERGIES: No Known Allergies  MEDICATION LIST PRIOR TO VISIT: Current Meds  Medication Sig   aspirin EC 81 MG tablet Take 81 mg by mouth daily. Swallow whole.   hydrochlorothiazide (HYDRODIURIL) 25 MG tablet TAKE 1  TABLET BY MOUTH ONCE DAILY IN THE MORNING   losartan (COZAAR) 50 MG tablet TAKE 1 TABLET BY MOUTH ONCE DAILY AT 10 PM   Multiple Vitamin (MULTIVITAMIN ADULT) TABS Take 1 tablet by mouth daily at 12 noon.   rosuvastatin (CRESTOR) 20 MG tablet Take 20 mg by mouth daily.   [DISCONTINUED] ezetimibe (ZETIA) 10 MG tablet Take 1 tablet (10 mg total) by mouth daily.     PAST MEDICAL HISTORY: Past Medical History:  Diagnosis Date   Carotid artery plaque, bilateral    Coronary artery disease due to calcified coronary lesion    High cholesterol    Lyme disease     PAST SURGICAL HISTORY: Past Surgical History:  Procedure Laterality Date   CESAREAN SECTION     x2   COLONOSCOPY     TOTAL KNEE ARTHROPLASTY Left 08/28/2020   Procedure: LEFT TOTAL KNEE ARTHROPLASTY;  Surgeon: Nadara Mustard, MD;  Location: [facility] OR;  Service: Orthopedics;  Laterality: Left;   TUBAL LIGATION     WISDOM TOOTH EXTRACTION      FAMILY HISTORY: The patient family history includes Alzheimer's disease in her maternal grandfather, maternal grandmother, and mother; Breast cancer in her maternal grandmother; Cancer in her father; Heart disease in her maternal grandmother and mother; Hypertension in her mother; Stroke (age of onset: 8) in her mother.  SOCIAL HISTORY:  The patient  reports that she quit smoking about 3 years ago. Her smoking use included cigarettes. She started smoking about 23 years ago. She has a 10 pack-year smoking history. She has never been exposed to tobacco smoke. She has never used smokeless  tobacco. She reports that she does not currently use alcohol. She reports that she does not use drugs.  REVIEW OF SYSTEMS: Review of Systems  Cardiovascular:  Negative for chest pain, claudication, dyspnea on exertion, irregular heartbeat, leg swelling, near-syncope, orthopnea, palpitations, paroxysmal nocturnal dyspnea and syncope.  Respiratory:  Negative for shortness of breath.   Hematologic/Lymphatic: Negative  for bleeding problem.  Musculoskeletal:  Negative for muscle cramps and myalgias.  Neurological:  Negative for dizziness and light-headedness.    PHYSICAL EXAM:    05/09/2023    3:20 PM 02/28/2023    9:53 AM 02/21/2023    8:25 AM  Vitals with BMI  Height 5\' 3"  5\' 3"    Weight 152 lbs 154 lbs 6 oz   BMI 26.93 27.36   Systolic 117 110 161  Diastolic 72 57 61  Pulse 68 52 57   Physical Exam  Constitutional: No distress.  Age appropriate, hemodynamically stable.   Neck: No JVD present.  Cardiovascular: Normal rate, regular rhythm, S1 normal, S2 normal, intact distal pulses and normal pulses. Exam reveals no gallop, no S3 and no S4.  No murmur heard. Pulmonary/Chest: Effort normal and breath sounds normal. No stridor. She has no wheezes. She has no rales.  Abdominal: Soft. Bowel sounds are normal. She exhibits no distension. There is no abdominal tenderness.  Musculoskeletal:        General: No edema.     Cervical back: Neck supple.  Neurological: She is alert and oriented to person, place, and time. She has intact cranial nerves (2-12).  Skin: Skin is warm and moist.   CARDIAC DATABASE: EKG: 12/27/2022: Sinus rhythm, 60 bpm, normal axis, without underlying ischemia injury pattern.  Echocardiogram: 02/22/2021:  Normal LV systolic function with visual EF 60-65%. Left ventricle cavity is normal in size. Mild left ventricular hypertrophy. Normal global wall motion. Normal diastolic filling pattern, normal LAP.  Mild tricuspid regurgitation. No evidence of pulmonary hypertension.  No prior study for comparison.  Stress Testing: Lexiscan Tetrofosmin stress test 03/08/2021: Lexiscan nuclear stress test performed using 1-day protocol. Normal myocardial perfusion. Stress LVEF 66%. Low risk study.  Coronary artery calcium scoring: 01/26/2021 Left main: 0. LAD 196. LCx: 0. RCA 220 Total coronary calcium score 416, 99 percentile for same sex, age, and ethnicity matched cohorts  Heart  Catheterization: None  CCTA  02/21/2023 1. Total coronary calcium score of 572. This was 99th percentile for age and sex matched control.   2. Normal coronary origin with right dominance.   3. CAD-RADS = 3.   Left Main: Mild (25-49%) stenosis at distal segment due to calcified plaque and extends to ostial LAD.  LAD: Mild (25-49%) stenosis at ostial segment due to calcified plaque. Moderate (50-69%) proximal / mid segment due to mixed (but predominantly calcified) plaque.   LCX: Minimal calcified plaque within proximal/mid LCx otherwise patent.   RCA: Mild (25-49%) stenosis at proximal segment due to calcified plaque. Moderate (50-69%) stenosis at proximal / mid segment due to mixed plaque.   4. Study is sent for CT-FFR and findings will be performed and reported separately.  Non-cardiac CT read: No significant extracardiac findings within the visualized chest.   CTFFR  06'/02/2023 1. Left Main: FFR = 0.99 2. LAD: Proximal FFR = 0.96, mid FFR = 0.92, distal FFR = 0.86 3. LCX: Proximal FFR = 0.98, distal FFR = 0.90 4. RCA: Proximal FFR = 0.98, mid FFR =0.91, distal FFR = 0.80   IMPRESSION: 1. CT FFR analysis showed no significant  stenosis; however, CT FFR at mid/distal RCA segment is within the indeterminate zone  Carotid duplex: 03/05/2021: Right Carotid: Velocities in the right ICA are consistent with a 40-59% stenosis. The ECA appears >50% stenosed.  Left Carotid: Velocities in the left ICA are consistent with a 1-39% stenosis.  Vertebrals:  Bilateral vertebral arteries demonstrate antegrade flow.  Subclavians: Normal flow hemodynamics were seen in bilateral subclavian arteries.   11/16/2022: Right Carotid: Velocities in the right ICA are consistent with a 1-39%  stenosis.   Left Carotid: Velocities in the left ICA are consistent with a 1-39%  stenosis.   Vertebrals: Bilateral vertebral arteries demonstrate antegrade flow.  Subclavians: Normal flow hemodynamics were  seen in bilateral subclavian               arteries.   LABORATORY DATA:    Latest Ref Rng & Units 08/26/2020    9:39 AM 03/14/2018    9:41 AM 10/02/2013    9:13 AM  CBC  WBC 4.0 - 10.5 K/uL 6.3  14.1  9.2   Hemoglobin 12.0 - 15.0 g/dL 51.8  84.1  66.0   Hematocrit 36.0 - 46.0 % 42.9  41.3  42.7   Platelets 150 - 400 K/uL 251  219  214        Latest Ref Rng & Units 04/25/2023    7:53 AM 02/15/2023    8:23 AM 01/03/2023    8:18 AM  CMP  Glucose 70 - 99 mg/dL 630  160  109   BUN 6 - 24 mg/dL 11  13  13    Creatinine 0.57 - 1.00 mg/dL 3.23  5.57  3.22   Sodium 134 - 144 mmol/L 136  137  134   Potassium 3.5 - 5.2 mmol/L 4.0  3.7  4.4   Chloride 96 - 106 mmol/L 94  94  93   CO2 20 - 29 mmol/L 31  28  27    Calcium 8.7 - 10.2 mg/dL 9.6  02.5  42.7   Total Protein 6.0 - 8.5 g/dL 7.0  7.0    Total Bilirubin 0.0 - 1.2 mg/dL 0.5  0.5    Alkaline Phos 44 - 121 IU/L 90  104    AST 0 - 40 IU/L 19  20    ALT 0 - 32 IU/L 19  19      Lipid Panel  Lab Results  Component Value Date   CHOL 131 04/25/2023   HDL 58 04/25/2023   LDLCALC 45 04/25/2023   LDLDIRECT 38 04/25/2023   TRIG 173 (H) 04/25/2023   CHOLHDL 4.5 03/14/2018     No components found for: "NTPROBNP" No results for input(s): "PROBNP" in the last 8760 hours. No results for input(s): "TSH" in the last 8760 hours.  BMP Recent Labs    01/03/23 0818 02/15/23 0823 04/25/23 0753  NA 134 137 136  K 4.4 3.7 4.0  CL 93* 94* 94*  CO2 27 28 31*  GLUCOSE 115* 113* 109*  BUN 13 13 11   CREATININE 0.79 0.86 0.75  CALCIUM 10.0 10.1 9.6    HEMOGLOBIN A1C No results found for: "HGBA1C", "MPG"   External Labs:  Date Collected: 01/05/2021 , information obtained by PCP Potassium: 5.2 Creatinine 0.73 mg/dL. eGFR: 97 mL/min per 1.73 m Hemoglobin: 13.2 g/dL and hematocrit: 06.2 % AST: 17 , ALT: 14 , alkaline phosphatase: 108  Lipid profile: Collected 01/05/2021 Total cholesterol 206, triglycerides 203, HDL 54, LDL calculated  117 AST 17, ALT 14, alkaline phosphatase  108.  External Labs: Collected: 12/13/2022 provided by the patient. A1c 5.9. Total cholesterol 142, triglycerides 110, HDL 61, LDL calculated 59, non-HDL 81 hemoglobin 13.6, hematocrit 41.3%.  IMPRESSION:    ICD-10-CM   1. Calcification of native coronary artery  I25.10 ezetimibe (ZETIA) 10 MG tablet   I25.84     2. Pure hypercholesterolemia  E78.00 ezetimibe (ZETIA) 10 MG tablet    3. Bilateral carotid artery stenosis  I65.23 ezetimibe (ZETIA) 10 MG tablet    4. Benign hypertension  I10 amLODipine (NORVASC) 5 MG tablet        RECOMMENDATIONS: Robin Bowers is a 57 y.o. female whose past medical history and cardiac risk factors include: Coronary artery calcification, bilateral carotid artery atherosclerosis, hypercholesterolemia, family history of heart disease, insomnia, postmenopausal, former smoker.  Calcification of native coronary artery Total CAC 572, 99 percentile Continue aspirin, Zetia, statin therapy. Denies chest pain since last office visit Prior EKG: Nonischemic. CCTA: CAD-RAD3 disease w/ no hemodynamically significant disease.  However, CTFFR at the mid to distal RCA notable for intermediate disease. She remains asymptomatic despite up titration of physical activity. LDL levels have improved from 162 mg/dL to 38 mg/dL. Blood pressure is also now better controlled on current medical therapy. Reemphasized the importance of secondary prevention with focus on improving her modifiable cardiovascular risk factors such as glycemic control, lipid management, blood pressure control, weight loss. Educated on seeking medical attention sooner by going to the closest ER via EMS if the symptoms increase in intensity, frequency, duration, or has typical chest pain as discussed in the office.  Patient verbalized understanding.  Benign hypertension Office blood pressures are well-controlled and so are her home blood pressure  readings. Medications refilled. No changes warranted.  Bilateral carotid artery stenosis Most recent carotid duplex notes improvement in overall disease burden. Currently follows with vascular and vein specialists  Pure hypercholesterolemia Indexed LDL level 162 mg/dL. Most recent LDL levels 38 mg/dL Continue pharmacological therapy.   Does not endorse myalgias.  On the most recent lipid profile her triglycerides are not at goal.  I suspect she did not fast for an appropriate duration of time.  Continue current medical therapy and will reevaluate at the next blood draw.  FINAL MEDICATION LIST END OF ENCOUNTER: Meds ordered this encounter  Medications   ezetimibe (ZETIA) 10 MG tablet    Sig: Take 1 tablet (10 mg total) by mouth daily.    Dispense:  90 tablet    Refill:  3   amLODipine (NORVASC) 5 MG tablet    Sig: Take 1 tablet (5 mg total) by mouth daily.    Dispense:  180 tablet    Refill:  3    Medications Discontinued During This Encounter  Medication Reason   amLODipine (NORVASC) 5 MG tablet Reorder   ezetimibe (ZETIA) 10 MG tablet Reorder      Current Outpatient Medications:    aspirin EC 81 MG tablet, Take 81 mg by mouth daily. Swallow whole., Disp: , Rfl:    hydrochlorothiazide (HYDRODIURIL) 25 MG tablet, TAKE 1 TABLET BY MOUTH ONCE DAILY IN THE MORNING, Disp: 90 tablet, Rfl: 0   losartan (COZAAR) 50 MG tablet, TAKE 1 TABLET BY MOUTH ONCE DAILY AT 10 PM, Disp: 30 tablet, Rfl: 0   Multiple Vitamin (MULTIVITAMIN ADULT) TABS, Take 1 tablet by mouth daily at 12 noon., Disp: , Rfl:    rosuvastatin (CRESTOR) 20 MG tablet, Take 20 mg by mouth daily., Disp: , Rfl:    amLODipine (NORVASC)  5 MG tablet, Take 1 tablet (5 mg total) by mouth daily., Disp: 180 tablet, Rfl: 3   ezetimibe (ZETIA) 10 MG tablet, Take 1 tablet (10 mg total) by mouth daily., Disp: 90 tablet, Rfl: 3  No orders of the defined types were placed in this encounter.  There are no Patient Instructions on  file for this visit.   --Continue cardiac medications as reconciled in final medication list. --Return in about 4 months (around 09/08/2023) for Follow up, Coronary artery calcification, CAD. Or sooner if needed. --Continue follow-up with your primary care physician regarding the management of your other chronic comorbid conditions.  Patient's questions and concerns were addressed to her satisfaction. She voices understanding of the instructions provided during this encounter.   This note was created using a voice recognition software as a result there may be grammatical errors inadvertently enclosed that do not reflect the nature of this encounter. Every attempt is made to correct such errors.  Tessa Lerner, [place], [facility]  Pager:  [phone] Office: [phone]

## 2023-05-10 DIAGNOSIS — Z713 Dietary counseling and surveillance: Secondary | ICD-10-CM | POA: Diagnosis not present

## 2023-05-10 DIAGNOSIS — Z6826 Body mass index (BMI) 26.0-26.9, adult: Secondary | ICD-10-CM | POA: Diagnosis not present

## 2023-05-18 ENCOUNTER — Other Ambulatory Visit: Payer: Self-pay | Admitting: Cardiology

## 2023-05-18 DIAGNOSIS — I10 Essential (primary) hypertension: Secondary | ICD-10-CM

## 2023-06-07 DIAGNOSIS — Z6825 Body mass index (BMI) 25.0-25.9, adult: Secondary | ICD-10-CM | POA: Diagnosis not present

## 2023-06-07 DIAGNOSIS — Z713 Dietary counseling and surveillance: Secondary | ICD-10-CM | POA: Diagnosis not present

## 2023-06-08 NOTE — Telephone Encounter (Signed)
Error message

## 2023-06-16 ENCOUNTER — Other Ambulatory Visit: Payer: Self-pay | Admitting: Cardiology

## 2023-06-16 DIAGNOSIS — I10 Essential (primary) hypertension: Secondary | ICD-10-CM

## 2023-06-21 ENCOUNTER — Other Ambulatory Visit: Payer: Self-pay | Admitting: Cardiology

## 2023-06-21 DIAGNOSIS — I10 Essential (primary) hypertension: Secondary | ICD-10-CM

## 2023-06-21 DIAGNOSIS — R7303 Prediabetes: Secondary | ICD-10-CM | POA: Diagnosis not present

## 2023-06-21 LAB — LAB REPORT - SCANNED
A1c: 6
EGFR: 98

## 2023-07-04 DIAGNOSIS — I10 Essential (primary) hypertension: Secondary | ICD-10-CM | POA: Diagnosis not present

## 2023-07-04 DIAGNOSIS — Z0000 Encounter for general adult medical examination without abnormal findings: Secondary | ICD-10-CM | POA: Diagnosis not present

## 2023-07-16 NOTE — Progress Notes (Signed)
External Labs: Collected: 06/21/2023 provided by PCP. BUN 15, creatinine 0.71. Sodium 141, potassium 4.4, chloride 98, bicarb 25. AST 25, ALT 32, alkaline phosphatase 92 A1c 6.0 Total cholesterol 123, triglycerides 88, HDL 66, LDL 40  LDL and triglycerides are at goal. Continue current medical therapy. Excellent improvement.  Robin Raftery Goodrich, DO, [facility]

## 2023-07-22 ENCOUNTER — Other Ambulatory Visit: Payer: Self-pay | Admitting: Cardiology

## 2023-07-22 DIAGNOSIS — I10 Essential (primary) hypertension: Secondary | ICD-10-CM

## 2023-07-25 DIAGNOSIS — I2510 Atherosclerotic heart disease of native coronary artery without angina pectoris: Secondary | ICD-10-CM | POA: Diagnosis not present

## 2023-07-25 DIAGNOSIS — E782 Mixed hyperlipidemia: Secondary | ICD-10-CM | POA: Diagnosis not present

## 2023-07-25 DIAGNOSIS — I10 Essential (primary) hypertension: Secondary | ICD-10-CM | POA: Diagnosis not present

## 2023-07-25 DIAGNOSIS — I6523 Occlusion and stenosis of bilateral carotid arteries: Secondary | ICD-10-CM | POA: Diagnosis not present

## 2023-08-01 DIAGNOSIS — I2510 Atherosclerotic heart disease of native coronary artery without angina pectoris: Secondary | ICD-10-CM | POA: Diagnosis not present

## 2023-09-28 DIAGNOSIS — R7303 Prediabetes: Secondary | ICD-10-CM | POA: Diagnosis not present

## 2023-09-28 DIAGNOSIS — I6523 Occlusion and stenosis of bilateral carotid arteries: Secondary | ICD-10-CM | POA: Diagnosis not present

## 2023-10-05 DIAGNOSIS — I2510 Atherosclerotic heart disease of native coronary artery without angina pectoris: Secondary | ICD-10-CM | POA: Diagnosis not present

## 2023-10-05 DIAGNOSIS — R7303 Prediabetes: Secondary | ICD-10-CM | POA: Diagnosis not present

## 2023-10-05 DIAGNOSIS — I6523 Occlusion and stenosis of bilateral carotid arteries: Secondary | ICD-10-CM | POA: Diagnosis not present

## 2023-10-05 DIAGNOSIS — E7800 Pure hypercholesterolemia, unspecified: Secondary | ICD-10-CM | POA: Diagnosis not present

## 2023-12-12 DIAGNOSIS — Z713 Dietary counseling and surveillance: Secondary | ICD-10-CM | POA: Diagnosis not present

## 2023-12-12 DIAGNOSIS — Z6825 Body mass index (BMI) 25.0-25.9, adult: Secondary | ICD-10-CM | POA: Diagnosis not present

## 2023-12-28 DIAGNOSIS — Z713 Dietary counseling and surveillance: Secondary | ICD-10-CM | POA: Diagnosis not present

## 2024-01-15 DIAGNOSIS — Z713 Dietary counseling and surveillance: Secondary | ICD-10-CM | POA: Diagnosis not present

## 2024-01-15 DIAGNOSIS — Z6825 Body mass index (BMI) 25.0-25.9, adult: Secondary | ICD-10-CM | POA: Diagnosis not present

## 2024-01-16 ENCOUNTER — Other Ambulatory Visit: Payer: Self-pay | Admitting: Physician Assistant

## 2024-01-16 DIAGNOSIS — Z1231 Encounter for screening mammogram for malignant neoplasm of breast: Secondary | ICD-10-CM

## 2024-01-23 ENCOUNTER — Ambulatory Visit
Admission: RE | Admit: 2024-01-23 | Discharge: 2024-01-23 | Disposition: A | Discharge status: Home / Self Care | Source: Ambulatory Visit | Attending: Physician Assistant | Admitting: Physician Assistant

## 2024-01-23 DIAGNOSIS — Z1231 Encounter for screening mammogram for malignant neoplasm of breast: Secondary | ICD-10-CM

## 2024-02-20 DIAGNOSIS — Z713 Dietary counseling and surveillance: Secondary | ICD-10-CM | POA: Diagnosis not present

## 2024-02-20 DIAGNOSIS — Z6825 Body mass index (BMI) 25.0-25.9, adult: Secondary | ICD-10-CM | POA: Diagnosis not present

## 2024-03-19 DIAGNOSIS — Z713 Dietary counseling and surveillance: Secondary | ICD-10-CM | POA: Diagnosis not present

## 2024-03-19 DIAGNOSIS — Z6825 Body mass index (BMI) 25.0-25.9, adult: Secondary | ICD-10-CM | POA: Diagnosis not present

## 2024-04-04 DIAGNOSIS — R7303 Prediabetes: Secondary | ICD-10-CM | POA: Diagnosis not present

## 2024-04-04 LAB — LAB REPORT - SCANNED
A1c: 5.9
EGFR (Non-African Amer.): 84

## 2024-04-11 DIAGNOSIS — R7303 Prediabetes: Secondary | ICD-10-CM | POA: Diagnosis not present

## 2024-04-11 DIAGNOSIS — I2510 Atherosclerotic heart disease of native coronary artery without angina pectoris: Secondary | ICD-10-CM | POA: Diagnosis not present

## 2024-04-11 DIAGNOSIS — I6523 Occlusion and stenosis of bilateral carotid arteries: Secondary | ICD-10-CM | POA: Diagnosis not present

## 2024-04-11 DIAGNOSIS — I10 Essential (primary) hypertension: Secondary | ICD-10-CM | POA: Diagnosis not present

## 2024-04-14 ENCOUNTER — Ambulatory Visit: Payer: Self-pay | Admitting: Cardiology

## 2024-04-16 ENCOUNTER — Ambulatory Visit: Attending: Internal Medicine | Admitting: Cardiology

## 2024-04-16 ENCOUNTER — Encounter: Payer: Self-pay | Admitting: Cardiology

## 2024-04-16 VITALS — BP 131/77 | HR 64 | Resp 16 | Ht 63.0 in | Wt 151.0 lb

## 2024-04-16 DIAGNOSIS — I2584 Coronary atherosclerosis due to calcified coronary lesion: Secondary | ICD-10-CM | POA: Diagnosis not present

## 2024-04-16 DIAGNOSIS — E7800 Pure hypercholesterolemia, unspecified: Secondary | ICD-10-CM | POA: Diagnosis not present

## 2024-04-16 DIAGNOSIS — I10 Essential (primary) hypertension: Secondary | ICD-10-CM | POA: Diagnosis not present

## 2024-04-16 DIAGNOSIS — I2510 Atherosclerotic heart disease of native coronary artery without angina pectoris: Secondary | ICD-10-CM | POA: Diagnosis not present

## 2024-04-16 DIAGNOSIS — I6523 Occlusion and stenosis of bilateral carotid arteries: Secondary | ICD-10-CM

## 2024-04-16 NOTE — Progress Notes (Signed)
 Cardiology Office Note:  .   Date:  04/16/2024  ID:  Robin Bowers, DOB Feb 27, 1966, MRN [id number] PCP:  Samie Frederick, PA-C  Former Cardiology Providers: NA [facility] Providers Cardiologist:  Madonna Large, DO , [facility] (established care 02/17/2021) Electrophysiologist:  None  Click to update primary MD,subspecialty MD or APP then REFRESH:1}    Chief Complaint  Patient presents with   Calcification of native coronary artery   Follow-up    History of Present Illness: .   Robin Bowers is a 58 y.o. Caucasian female whose past medical history and cardiovascular risk factors includes: Severe coronary artery calcification, bilateral carotid artery atherosclerosis, hypercholesterolemia, family history of heart disease, insomnia, postmenopausal, former smoker.   Known history of coronary artery calcification dating back to 2022 at that time she did have ischemic workup.  However she came back to the office in April 2022 due to symptoms of chest pressure.  She underwent coronary CTA for evaluation of obstructive disease.  Her coronary calcium  score had trended up compared to her prior study, was noted to have moderate disease in the LAD and RCA distribution.  CT FFR was performed and did not show any significant disease however mid to distal RCA segment was in the intermediate range.  Shared decision was to monitor closely for symptoms and to uptitrate antianginal therapy and lipid-lowering agents.  She was last seen in the office in August 2024 at that time her LDL levels have improved from 162 mg/dL to 38 mg/dL.  She presents today for follow-up.  Since last office visit patient denies any anginal chest pain or heart failure symptoms.  No hospitalizations or urgent care visits for cardiovascular reasons.  She has been compliant with her medical therapy.  No significant weight gain.  Physical endurance remains stable.  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   Outside labs provided by her PCP from 04/04/2024 reviewed.  Review of Systems: .   Review of Systems  Cardiovascular:  Negative for chest pain, claudication, irregular heartbeat, leg swelling, near-syncope, orthopnea, palpitations, paroxysmal nocturnal dyspnea and syncope.  Respiratory:  Negative for shortness of breath.   Hematologic/Lymphatic: Negative for bleeding problem.    Studies Reviewed:   EKG: EKG Interpretation Date/Time:  Tuesday April 16 2024 11:55:31 EDT Ventricular Rate:  53 PR Interval:  156 QRS Duration:  80 QT Interval:  430 QTC Calculation: 403 R Axis:   94  Text Interpretation: Sinus bradycardia Rightward axis No previous ECGs available Confirmed by Large Madonna [phone]) on 04/16/2024 12:09:52 PM  Echocardiogram: 02/22/2021:  Normal LV systolic function with visual EF 60-65%. Left ventricle cavity is normal in size. Mild left ventricular hypertrophy. Normal global wall motion. Normal diastolic filling pattern, normal LAP.  Mild tricuspid regurgitation. No evidence of pulmonary hypertension.  No prior study for comparison.   Stress Testing: Lexiscan  Tetrofosmin stress test 03/08/2021: Lexiscan  nuclear stress test performed using 1-day protocol. Normal myocardial perfusion. Stress LVEF 66%. Low risk study.   Coronary artery calcium  scoring: 01/26/2021 Left main: 0. LAD 196. LCx: 0. RCA 220 Total coronary calcium  score 416, 99 percentile for same sex, age, and ethnicity matched cohorts   Heart Catheterization: None   CCTA  02/21/2023 1. Total coronary calcium  score of 572. This was 99th percentile for age and sex matched control.  2. Normal coronary origin with right dominance.   3. CAD-RADS = 3.   Left Main: Mild (25-49%) stenosis at distal segment due to calcified plaque and extends to ostial LAD.   LAD: Mild  (25-49%) stenosis at ostial segment due to calcified plaque. Moderate (50-69%) proximal / mid segment due to mixed (but predominantly calcified) plaque.   LCX: Minimal calcified plaque within proximal/mid LCx otherwise patent.   RCA: Mild (25-49%) stenosis at proximal segment due to calcified plaque. Moderate (50-69%) stenosis at proximal / mid segment due to mixed plaque.   4. Study is sent for CT-FFR and findings will be performed and reported separately.   Non-cardiac CT read: No significant extracardiac findings within the visualized chest.    CTFFR  06'/02/2023 1. Left Main: FFR = 0.99 2. LAD: Proximal FFR = 0.96, mid FFR = 0.92, distal FFR = 0.86 3. LCX: Proximal FFR = 0.98, distal FFR = 0.90 4. RCA: Proximal FFR = 0.98, mid FFR =0.91, distal FFR = 0.80   IMPRESSION: 1. CT FFR analysis showed no significant stenosis; however, CT FFR at mid/distal RCA segment is within the indeterminate zone   Carotid duplex: 03/05/2021: Right Carotid: Velocities in the right ICA are consistent with a 40-59% stenosis. The ECA appears >50% stenosed.  Left Carotid: Velocities in the left ICA are consistent with a 1-39% stenosis.  Vertebrals:  Bilateral vertebral arteries demonstrate antegrade flow.  Subclavians: Normal flow hemodynamics were seen in bilateral subclavian arteries.    11/16/2022: Right Carotid: Velocities in the right ICA are consistent with a 1-39% stenosis.   Left Carotid: Velocities in the left ICA are consistent with a 1-39% stenosis.   Vertebrals: Bilateral vertebral arteries demonstrate antegrade flow.  Subclavians: Normal flow hemodynamics were seen in bilateral subclavian arteries.   RADIOLOGY: N/A  Risk Assessment/Calculations:   NA   Labs:       Latest Ref Rng & Units 08/26/2020    9:39 AM 03/14/2018    9:41 AM 10/02/2013    9:13 AM  CBC  WBC 4.0 - 10.5 K/uL 6.3  14.1  9.2   Hemoglobin 12.0 - 15.0 g/dL 86.1  85.6  85.3   Hematocrit 36.0 - 46.0 % 42.9  41.3   42.7   Platelets 150 - 400 K/uL 251  219  214        Latest Ref Rng & Units 04/25/2023    7:53 AM 02/15/2023    8:23 AM 01/03/2023    8:18 AM  BMP  Glucose 70 - 99 mg/dL 890  886  884   BUN 6 - 24 mg/dL 11  13  13    Creatinine 0.57 - 1.00 mg/dL 9.24  9.13  9.20   BUN/Creat Ratio 9 - 23 15  15  16    Sodium 134 - 144 mmol/L 136  137  134   Potassium 3.5 - 5.2 mmol/L 4.0  3.7  4.4   Chloride 96 - 106 mmol/L 94  94  93   CO2 20 - 29 mmol/L 31  28  27    Calcium  8.7 - 10.2 mg/dL 9.6  89.8  89.9       Latest Ref Rng & Units 04/25/2023    7:53 AM 02/15/2023    8:23 AM 01/03/2023    8:18 AM  CMP  Glucose 70 - 99 mg/dL 890  886  884   BUN 6 - 24 mg/dL 11  13  13    Creatinine 0.57 - 1.00 mg/dL 9.24  9.13  0.79   Sodium 134 - 144 mmol/L 136  137  134   Potassium 3.5 - 5.2 mmol/L 4.0  3.7  4.4   Chloride 96 - 106 mmol/L 94  94  93   CO2 20 - 29 mmol/L 31  28  27    Calcium  8.7 - 10.2 mg/dL 9.6  89.8  89.9   Total Protein 6.0 - 8.5 g/dL 7.0  7.0    Total Bilirubin 0.0 - 1.2 mg/dL 0.5  0.5    Alkaline Phos 44 - 121 IU/L 90  104    AST 0 - 40 IU/L 19  20    ALT 0 - 32 IU/L 19  19      Lab Results  Component Value Date   CHOL 131 04/25/2023   HDL 58 04/25/2023   LDLCALC 45 04/25/2023   LDLDIRECT 38 04/25/2023   TRIG 173 (H) 04/25/2023   CHOLHDL 4.5 03/14/2018   No results for input(s): LIPOA in the last 8760 hours. No components found for: NTPROBNP No results for input(s): PROBNP in the last 8760 hours. No results for input(s): TSH in the last 8760 hours.  External Labs: Collected: April 04, 2024 provided by primary team. BUN 11, creatinine 0.81. Potassium 3.6. AST, ALT, alkaline phosphatase within normal limits. A1c 5.9 Total cholesterol 129, triglycerides 143, HDL 62, LDL calculated 43.  Physical Exam:    Today's Vitals   04/16/24 1151  BP: 131/77  Pulse: 64  Resp: 16  SpO2: 98%  Height: 5' 3 (1.6 m)   Body mass index is 26.93 kg/m. Wt Readings from Last 3  Encounters:  05/09/23 152 lb (68.9 kg)  02/28/23 154 lb 6.4 oz (70 kg)  02/07/23 155 lb 9.6 oz (70.6 kg)    Physical Exam  Constitutional: No distress.  hemodynamically stable  Neck: No JVD present.  Cardiovascular: Normal rate, regular rhythm, S1 normal and S2 normal. Exam reveals no gallop, no S3 and no S4.  No murmur heard. Pulmonary/Chest: Effort normal and breath sounds normal. No stridor. She has no wheezes. She has no rales.  Musculoskeletal:        General: No edema.     Cervical back: Neck supple.  Skin: Skin is warm.   Impression & Recommendation(s):  Impression:   ICD-10-CM   1. Calcification of native coronary artery  I25.10 EKG 12-Lead   I25.84 ECHOCARDIOGRAM COMPLETE    2. Pure hypercholesterolemia  E78.00     3. Benign hypertension  I10 ECHOCARDIOGRAM COMPLETE    4. Atherosclerosis of both carotid arteries  I65.23 VAS US  CAROTID       Recommendation(s):  Calcification of native coronary artery Total CAC 572, 99 percentile  CCTA: CAD-RAD3 disease w/ no hemodynamically significant disease.  However, CTFFR at the mid to distal RCA notable for intermediate disease.  Denies anginal chest pain. Continue aspirin  81 mg p.o. daily. Continue Crestor  20 mg p.o. nightly. Continue Zetia  10 mg p.o. daily. EKG is nonischemic. Physical endurance remains stable. Lipid management has significantly improved with pharmacological therapy LDL levels were as high as 162 mg/dL and most recent labs provided by PCP from July 2025 independently reviewed which noted LDL level of 43 mg/dL Reemphasized the importance of secondary prevention with focus on improving the modifiable cardiovascular risk factors such as glycemic control, lipid management, blood pressure control, weight loss. Plan echocardiogram prior to next office visit is a 4-year follow-up study  Pure hypercholesterolemia Currently on Crestor , Zetia  She denies myalgia or other side effects.  Most recent lipids dated April 04, 2024, independently reviewed as noted above.  LDL is 43 mg/dL.   Benign hypertension Office blood pressures are well-controlled. Continue amlodipine  5 mg p.o. daily. Continue hydrochlorothiazide  25 mg p.o. daily. Continue losartan  50 mg p.o. daily Reemphasized importance of low-salt diet.  Atherosclerosis of both carotid arteries Continue lipid-lowering agents as well as aspirin  81 mg p.o. daily. Prior carotid duplex notes bilateral carotid disease <40%. Use to follow-up with vascular surgery; however, since her disease is currently being managed medically shared decision was to follow with cardiology for now until the need for vascular surgery arises. Will order carotid duplex prior to next office visit   Orders Placed:  Orders Placed This Encounter  Procedures   EKG 12-Lead   ECHOCARDIOGRAM COMPLETE    Standing Status:   Future    Expected Date:   04/16/2025    Where [facility] performed[facility]    Does the patient weigh less than or greater than 250 lbs?:   Patient weighs less than 250 lbs    Perflutren DEFINITY (image enhancing agent) should be administered unless hypersensitivity or allergy exist:   Administer Perflutren    Reason for exam-Echo:   Other-Full Diagnosis List    Full ICD-10/Reason for Exam:   HTN (hypertension) [761881]    Full ICD-10/Reason for Exam:   Coronary artery calcification [8805182]     Final Medication List:   No orders of the defined types were placed in this encounter.   There are no discontinued medications.   Current Outpatient Medications:    amLODipine  (NORVASC ) 5 MG tablet, Take 1 tablet (5 mg total) by mouth daily., Disp: 180 tablet, Rfl: 3   aspirin  EC 81 MG tablet, Take 81 mg by mouth daily. Swallow whole., Disp: , Rfl:    ezetimibe  (ZETIA ) 10 MG tablet, Take 1 tablet (10 mg total) by mouth daily., Disp: 90 tablet, Rfl: 3   hydrochlorothiazide  (HYDRODIURIL ) 25 MG tablet, TAKE 1 TABLET BY MOUTH ONCE DAILY IN  THE MORNING, Disp: 90 tablet, Rfl: 03   losartan  (COZAAR ) 50 MG tablet, TAKE 1 TABLET BY MOUTH ONCE DAILY AT  10  PM, Disp: 90 tablet, Rfl: 2   Multiple Vitamin (MULTIVITAMIN ADULT) TABS, Take 1 tablet by mouth daily at 12 noon., Disp: , Rfl:    rosuvastatin  (CRESTOR ) 20 MG tablet, Take 20 mg by mouth daily., Disp: , Rfl:   Consent:   NA  Disposition:   1 year follow-up sooner if needed  Her questions and concerns were addressed to her satisfaction. She voices understanding of the recommendations provided during this encounter.    Signed, Madonna Michele HAS, [facility]  A Division of [facility] [address], [place], [place] [address]  [place], [place] [address] 04/16/2024 12:36 PM

## 2024-04-16 NOTE — Patient Instructions (Signed)
 Medication Instructions:  No medication changes were made at this visit. Continue current regimen.   *If you need a refill on your cardiac medications before your next appointment, please call your pharmacy*  Lab Work: None ordered today. If you have labs (blood work) drawn today and your tests are completely normal, you will receive your results only by: MyChart Message (if you have MyChart) OR A paper copy in the mail If you have any lab test that is abnormal or we need to change your treatment, we will call you to review the results.  Testing/Procedures: Your physician has requested that you have an echocardiogram to be completed in 1 year prior to follow-up with Dr. Michele. Echocardiography is a painless test that uses sound waves to create images of your heart. It provides your doctor with information about the size and shape of your heart and how well your heart's chambers and valves are working. This procedure takes approximately one hour. There are no restrictions for this procedure. Please do NOT wear cologne, perfume, aftershave, or lotions (deodorant is allowed). Please arrive 15 minutes prior to your appointment time.  Please note: We ask at that you not bring children with you during ultrasound (echo/ vascular) testing. Due to room size and safety concerns, children are not allowed in the ultrasound rooms during exams. Our front office staff cannot provide observation of children in our lobby area while testing is being conducted. An adult accompanying a patient to their appointment will only be allowed in the ultrasound room at the discretion of the ultrasound technician under special circumstances. We apologize for any inconvenience.   Your physician has requested that you have a carotid duplex to be completed in 1 year prior to follow-up with Dr. Michele. This test is an ultrasound of the carotid arteries in your neck. It looks at blood flow through these arteries that supply the  brain with blood. Allow one hour for this exam. There are no restrictions or special instructions.   Follow-Up: At [facility], you and your health needs are our priority.  As part of our continuing mission to provide you with exceptional heart care, our providers are all part of one team.  This team includes your primary Cardiologist (physician) and Advanced Practice Providers or APPs (Physician Assistants and Nurse Practitioners) who all work together to provide you with the care you need, when you need it.  Your next appointment:   1 year(s)  Provider:   Madonna Michele, DO

## 2024-04-23 DIAGNOSIS — Z6825 Body mass index (BMI) 25.0-25.9, adult: Secondary | ICD-10-CM | POA: Diagnosis not present

## 2024-04-23 DIAGNOSIS — Z713 Dietary counseling and surveillance: Secondary | ICD-10-CM | POA: Diagnosis not present

## 2024-04-25 ENCOUNTER — Other Ambulatory Visit: Payer: Self-pay | Admitting: Cardiology

## 2024-04-25 DIAGNOSIS — R072 Precordial pain: Secondary | ICD-10-CM

## 2024-05-20 ENCOUNTER — Other Ambulatory Visit: Payer: Self-pay | Admitting: Cardiology

## 2024-05-20 DIAGNOSIS — I6523 Occlusion and stenosis of bilateral carotid arteries: Secondary | ICD-10-CM

## 2024-05-20 DIAGNOSIS — E7800 Pure hypercholesterolemia, unspecified: Secondary | ICD-10-CM

## 2024-05-20 DIAGNOSIS — I2510 Atherosclerotic heart disease of native coronary artery without angina pectoris: Secondary | ICD-10-CM

## 2024-05-21 DIAGNOSIS — Z6826 Body mass index (BMI) 26.0-26.9, adult: Secondary | ICD-10-CM | POA: Diagnosis not present

## 2024-05-21 DIAGNOSIS — Z713 Dietary counseling and surveillance: Secondary | ICD-10-CM | POA: Diagnosis not present

## 2024-05-29 ENCOUNTER — Other Ambulatory Visit: Payer: Self-pay | Admitting: Cardiology

## 2024-05-29 DIAGNOSIS — I10 Essential (primary) hypertension: Secondary | ICD-10-CM

## 2024-05-31 ENCOUNTER — Telehealth: Payer: Self-pay | Admitting: Cardiology

## 2024-05-31 NOTE — Telephone Encounter (Signed)
 RX sent in on 05/30/24

## 2024-05-31 NOTE — Telephone Encounter (Signed)
*  STAT* If patient is at the pharmacy, call can be transferred to refill team.   1. Which medications need to be refilled? (please list name of each medication and dose if known)   losartan  (COZAAR ) 50 MG tablet     2. Would you like to learn more about the convenience, safety, & potential cost savings by using the [facility]? No   3. Are you open to using the [facility] (Type [facility]. No   4. Which pharmacy/location (including street and city if local pharmacy) is medication to be sent to? [id number] - [place], [place] - [address]     5. Do they need a 30 day or 90 day supply? 90 day   Pt is out of medication

## 2024-06-11 ENCOUNTER — Other Ambulatory Visit: Payer: Self-pay | Admitting: Cardiology

## 2024-06-11 DIAGNOSIS — I10 Essential (primary) hypertension: Secondary | ICD-10-CM

## 2024-07-16 ENCOUNTER — Other Ambulatory Visit: Payer: Self-pay

## 2024-07-16 ENCOUNTER — Telehealth: Payer: Self-pay | Admitting: Cardiology

## 2024-07-16 ENCOUNTER — Emergency Department (HOSPITAL_COMMUNITY)

## 2024-07-16 ENCOUNTER — Emergency Department (HOSPITAL_COMMUNITY)
Admission: EM | Admit: 2024-07-16 | Discharge: 2024-07-16 | Disposition: A | Discharge status: Home / Self Care | Attending: Emergency Medicine | Admitting: Emergency Medicine

## 2024-07-16 DIAGNOSIS — Z87891 Personal history of nicotine dependence: Secondary | ICD-10-CM | POA: Diagnosis not present

## 2024-07-16 DIAGNOSIS — R079 Chest pain, unspecified: Secondary | ICD-10-CM | POA: Diagnosis not present

## 2024-07-16 DIAGNOSIS — Z7982 Long term (current) use of aspirin: Secondary | ICD-10-CM | POA: Diagnosis not present

## 2024-07-16 DIAGNOSIS — R0789 Other chest pain: Secondary | ICD-10-CM | POA: Insufficient documentation

## 2024-07-16 LAB — TROPONIN I (HIGH SENSITIVITY)
Troponin I (High Sensitivity): 3 ng/L (ref <18)
Troponin I (High Sensitivity): 3 ng/L (ref <18)

## 2024-07-16 LAB — BASIC METABOLIC PANEL WITH GFR
Anion gap: 12 (ref 5–15)
BUN: 11 mg/dL (ref 6–20)
CO2: 29 mmol/L (ref 22–32)
Calcium: 9.3 mg/dL (ref 8.9–10.3)
Chloride: 97 mmol/L — ABNORMAL LOW (ref 98–111)
Creatinine, Ser: 0.9 mg/dL (ref 0.44–1.00)
GFR, Estimated: >60 mL/min (ref >60)
Glucose, Bld: 149 mg/dL — ABNORMAL HIGH (ref 70–99)
Potassium: 3.7 mmol/L (ref 3.5–5.1)
Sodium: 138 mmol/L (ref 135–145)

## 2024-07-16 LAB — CBC
HCT: 38.8 % (ref 36.0–46.0)
Hemoglobin: 12.9 g/dL (ref 12.0–15.0)
MCH: 30.1 pg (ref 26.0–34.0)
MCHC: 33.2 g/dL (ref 30.0–36.0)
MCV: 90.4 fL (ref 80.0–100.0)
Platelets: 221 K/uL (ref 150–400)
RBC: 4.29 MIL/uL (ref 3.87–5.11)
RDW: 13.2 % (ref 11.5–15.5)
WBC: 6.7 K/uL (ref 4.0–10.5)
nRBC: 0 % (ref 0.0–0.2)

## 2024-07-16 MED ORDER — ASPIRIN 81 MG PO CHEW
324.0000 mg | CHEWABLE_TABLET | Freq: Once | ORAL | Status: AC
  Administered 2024-07-16: 324 mg via ORAL
  Filled 2024-07-16: qty 4

## 2024-07-16 NOTE — ED Provider Notes (Signed)
 [place] EMERGENCY DEPARTMENT AT [facility] Provider Note   CSN: [id number] Arrival date & time: 07/16/24  1306     Patient presents with: Chest Pain   Lateefah Mallery Rogoff is a 58 y.o. female.    Chest Pain  Patient is a 58 year old female with past medical history significant for previous smoking history, Lyme disease, carotid artery plaques, high cholesterol, high coronary calcium  CT score  She presents emergency room today with complaints of left-sided chest pain that started 3 to 4 days ago she states that she has been recently doing some heavy lifting/bench pressing and has noticed some soreness in her left chest since that time.  Does not feel short of breath no nausea or vomiting (except mild nausea this AM).  Her pain does not seem to be exertional or pleuritic no associated mopped assist or leg swelling.  She follows with Dr. Michele after abnormal calcium  score on CT chest and has had stress test which she states was normal.  She states she has no chest pain currently no other associated symptoms.      Prior to Admission medications   Medication Sig Start Date End Date Taking? Authorizing Provider  amLODipine  (NORVASC ) 5 MG tablet Take 1 tablet (5 mg total) by mouth daily. 05/09/23 04/16/24  Tolia, Sunit, DO  aspirin  EC 81 MG tablet Take 81 mg by mouth daily. Swallow whole.    [provider]  ezetimibe  (ZETIA ) 10 MG tablet Take 1 tablet by mouth once daily 05/22/24   Tolia, Sunit, DO  hydrochlorothiazide  (HYDRODIURIL ) 25 MG tablet TAKE 1 TABLET BY MOUTH ONCE DAILY IN THE MORNING 06/11/24   Tolia, Sunit, DO  losartan  (COZAAR ) 50 MG tablet TAKE 1 TABLET BY MOUTH ONCE DAILY AT  10  PM 05/30/24   Tolia, Sunit, DO  Multiple Vitamin (MULTIVITAMIN ADULT) TABS Take 1 tablet by mouth daily at 12 noon.    [provider]  rosuvastatin  (CRESTOR ) 20 MG tablet Take 20 mg by mouth daily. 12/26/19   [provider]    Allergies: Patient has no known  allergies.    Review of Systems  Cardiovascular:  Positive for chest pain.    Updated Vital Signs BP (!) 164/68   Pulse (!) 56   Temp 97.9 F (36.6 C) (Oral)   Resp 10   Ht 5' 3 (1.6 m)   Wt 69.4 kg   SpO2 98%   BMI 27.10 kg/m   Physical Exam Vitals and nursing note reviewed.  Constitutional:      General: She is not in acute distress.    Comments: Pleasant well-appearing 58 year old.  In no acute distress.  Sitting comfortably in bed.  Able answer questions appropriately follow commands. No increased work of breathing. Speaking in full sentences.   HENT:     Head: Normocephalic and atraumatic.     Nose: Nose normal.  Eyes:     General: No scleral icterus. Cardiovascular:     Rate and Rhythm: Normal rate and regular rhythm.     Pulses: Normal pulses.     Heart sounds: Normal heart sounds.  Pulmonary:     Effort: Pulmonary effort is normal. No respiratory distress.     Breath sounds: Normal breath sounds. No wheezing.  Abdominal:     Palpations: Abdomen is soft.     Tenderness: There is no abdominal tenderness.  Musculoskeletal:     Cervical back: Normal range of motion.     Right lower leg: No edema.  Left lower leg: No edema.  Skin:    General: Skin is warm and dry.     Capillary Refill: Capillary refill takes less than 2 seconds.  Neurological:     Mental Status: She is alert. Mental status is at baseline.  Psychiatric:        Mood and Affect: Mood normal.        Behavior: Behavior normal.     (all labs ordered are listed, but only abnormal results are displayed) Labs Reviewed  BASIC METABOLIC PANEL WITH GFR - Abnormal; Notable for the following components:      Result Value   Chloride 97 (*)    Glucose, Bld 149 (*)    All other components within normal limits  CBC  TROPONIN I (HIGH SENSITIVITY)  TROPONIN I (HIGH SENSITIVITY)    EKG: EKG Interpretation Date/Time:  Tuesday July 16 2024 13:20:12 EDT Ventricular Rate:  68 PR  Interval:  132 QRS Duration:  82 QT Interval:  398 QTC Calculation: 423 R Axis:   78  Text Interpretation: Normal sinus rhythm Normal ECG When compared with ECG of 16-Apr-2024 11:55, No significant change since last tracing Confirmed by Patsey Lot [phone]) on 07/16/2024 7:13:29 PM  Radiology: [facility] Chest 2 View Result Date: 07/16/2024 CLINICAL DATA:  Intermittent LEFT chest pain beginning 3-4 days ago which radiates to the back and LEFT shoulder blade. EXAM: CHEST - 2 VIEW COMPARISON:  03/15/2005 FINDINGS: Cardiomediastinal silhouette and pulmonary vasculature are within normal limits. Lungs are clear. Deformity of the LEFT clavicle consistent with remote healed fracture. IMPRESSION: No acute cardiopulmonary process. Electronically Signed   By: Aliene Lloyd M.D.   On: 07/16/2024 13:57     Procedures   Medications Ordered in the ED  aspirin  chewable tablet 324 mg (324 mg Oral Given 07/16/24 1413)                                    Medical Decision Making Amount and/or Complexity of Data Reviewed Labs: ordered. Radiology: ordered.  Risk OTC drugs.   This patient presents to the ED for concern of CP, this involves a number of treatment options, and is a complaint that carries with it a high risk of complications and morbidity. A differential diagnosis was considered for the patient's symptoms which is discussed below:   The emergent causes of chest pain include: Acute coronary syndrome, tamponade, pericarditis/myocarditis, aortic dissection, pulmonary embolism, tension pneumothorax, pneumonia, and esophageal rupture.   I do not believe the patient has an emergent cause of chest pain, other urgent/non-acute considerations include, but are not limited to: chronic angina, aortic stenosis, cardiomyopathy, mitral valve prolapse, pulmonary hypertension, aortic insufficiency, right ventricular hypertrophy, pleuritis, bronchitis, pneumothorax, tumor, gastroesophageal reflux disease  (GERD), esophageal spasm, Mallory-Weiss syndrome, peptic ulcer disease, pancreatitis, functional gastrointestinal pain, cervical or thoracic disk disease or arthritis, shoulder arthritis, costochondritis, subacromial bursitis, anxiety or panic attack, herpes zoster, breast disorders, chest wall tumors, thoracic outlet syndrome, mediastinitis.    Co morbidities: Discussed in HPI   Brief History:  Patient is a 58 year old female with past medical history significant for previous smoking history, Lyme disease, carotid artery plaques, high cholesterol, high coronary calcium  CT score  She presents emergency room today with complaints of left-sided chest pain that started 3 to 4 days ago she states that she has been recently doing some heavy lifting/bench pressing and has noticed some soreness in her left chest since  that time.  Does not feel short of breath no nausea or vomiting (except mild nausea this AM).  Her pain does not seem to be exertional or pleuritic no associated mopped assist or leg swelling.  She follows with Dr. Michele after abnormal calcium  score on CT chest and has had stress test which she states was normal.  She states she has no chest pain currently no other associated symptoms.    EMR reviewed including pt PMHx, past surgical history and past visits to ER.   See HPI for more details   Lab Tests:   I ordered and independently interpreted labs. Labs notable for Troponin flat and negative, BMP unremarkable CBC unremarkable  Imaging Studies:  NAD. I personally reviewed all imaging studies and no acute abnormality found. I agree with radiology interpretation. Chest x-ray without infiltrate, or other abnormal finding   Cardiac Monitoring:  The patient was maintained on a cardiac monitor.  I personally viewed and interpreted the cardiac monitored which showed an underlying rhythm of: NSR EKG non-ischemic   Medicines ordered:  I ordered medication including aspirin   for chest pain Reevaluation of the patient after these medicines showed that the patient resolved I have reviewed the patients home medicines and have made adjustments as needed   Critical Interventions:     Consults/Attending Physician   I discussed this case with my attending physician who cosigned this note including patient's presenting symptoms, physical exam, and planned diagnostics and interventions. Attending physician stated agreement with plan or made changes to plan which were implemented.   Reevaluation:  After the interventions noted above I re-evaluated patient and found that they have :stayed the same   Social Determinants of Health:      Problem List / ED Course:  Patient has had some intermittent chest pain over the past 3 to 4 days seems to be worse with certain movement is not reproducible on my exam but does not seem to be exertional or pleuritic normal troponins EKG unchanged from prior had a lengthy discussion with patient regarding risk tolerance and she states that she will follow-up with her cardiologist who she is already established with she will turn to the emergency room for any chest pain difficulty breathing or any other new or associated symptoms.   Dispostion:  After consideration of the diagnostic results and the patients response to treatment, I feel that the patent would benefit from close outpatient follow-up.  Strict return precautions.    Final diagnoses:  Atypical chest pain    ED Discharge Orders     None          Neldon [name], [name] 07/16/24 1946    Patsey Lot, MD 07/16/24 2249

## 2024-07-16 NOTE — ED Triage Notes (Addendum)
 Pt c/o intermittent left sided chest pain that started about 3-4 days ago, radiates to back and left shoulder blade.

## 2024-07-16 NOTE — Telephone Encounter (Signed)
 Pt c/o of Chest Pain: STAT if active (IN THIS MOMENT) CP, including tightness, pressure, jaw pain, shoulder/upper arm/back pain, SOB, nausea, and vomiting.  1. Are you having CP right now (tightness, pressure, or discomfort)? Patient states that she having pay under her left arm that runs into her chest.   2. Are you experiencing any other symptoms (ex. SOB, nausea, vomiting, sweating)? No   3. How long have you been experiencing CP? Few day  4. Is your CP continuous or coming and going? Coming and going   5. Have you taken Nitroglycerin ? no  6. If CP returns before callback, please consider calling 911. ?

## 2024-07-16 NOTE — ED Provider Triage Note (Signed)
 Emergency Medicine Provider Triage Evaluation Note  Robin Bowers , a 58 y.o. female  was evaluated in triage.  Pt complains of chest pain L sided for a few days. States pain has been intermittent.  Not reliably exertional not pleuritic. Can be sharp or pressure. Rads to L shoulder.  Some nausea this AM  No recent surgeries, hospitalization, long travel, hemoptysis, estrogen containing OCP, cancer history.  No unilateral leg swelling.  No history of PE or VTE.   Review of Systems  Positive: CP Negative: SOB  Physical Exam  BP (!) 159/68 (BP Location: Right Arm)   Pulse 72   Temp 97.6 F (36.4 C) (Oral)   Resp 18   Ht 5' 3 (1.6 m)   Wt 69.4 kg   SpO2 97%   BMI 27.10 kg/m  Gen:   Awake, no distress   Resp:  Normal effort  MSK:   Moves extremities without difficulty  Other:    Medical Decision Making  Medically screening exam initiated at 1:58 PM.  Appropriate orders placed.  Robin Bowers was informed that the remainder of the evaluation will be completed by another provider, this initial triage assessment does not replace that evaluation, and the importance of remaining in the ED until their evaluation is complete.  CP workup    Robin Bowers, Robin Bowers 07/16/24 1401

## 2024-07-16 NOTE — Discharge Instructions (Signed)
 Please continue taking aspirin  1 time daily 81 mg.  Call your cardiologist tomorrow to make an appointment.  Return to the emergency room for any new or concerning symptoms as we discussed.

## 2024-07-16 NOTE — Telephone Encounter (Signed)
 Pt reports she had some pain around my arm pit and behind my back between shoulders and into my chest.  She also says she has been using some weights and thought it could be related.  Occurring for 4/5 days now. Pt advised to go to ED for evaluation/testing.  Apologized to pt for having to send her to ED but that with calcium  hx and reported symptoms that is the best place for evaluation.  Pt agreeable. Forwarding to MD for his FYI and to be on look out for ED notes.

## 2024-07-17 NOTE — Telephone Encounter (Signed)
 Call to patient to relay that Dr. Michele would like her to keep appt with APP on 08/01/24. Patient verbalizes understanding and agrees to plan, asks to be put on wait list in case Dr. Michele has a cancellation. Forwarded to scheduling.

## 2024-07-17 NOTE — Telephone Encounter (Signed)
 ER records reviewed. Please advise her to follow-up with Ms. Kamyah Wilhelmsen 08/01/2024.  Delon, Just FYI.   Dr. Margaret Staggs

## 2024-07-23 ENCOUNTER — Other Ambulatory Visit: Payer: Self-pay | Admitting: Cardiology

## 2024-07-23 DIAGNOSIS — I10 Essential (primary) hypertension: Secondary | ICD-10-CM

## 2024-07-29 NOTE — Progress Notes (Signed)
 " Cardiology Office Note   Date:  08/01/2024  ID:  Robin Bowers, DOB 10/19/1965, MRN [id number] PCP: Royden Ronal Czar, FNP  [facility] Providers Cardiologist:  Madonna Large, DO     History of Present Illness Robin Bowers is a 58 y.o. female with a past medical history of CAD, carotid artery stenosis, Lyme's disease, former tobacco abuse.  02/23/2023 coronary CT calcium  score 572, 99th percentile, FFR revealed no significant stenosis however CT FFR mid distal RCA within the indeterminant zone 08/01/2023 echo EF 60-65%, trace MR, trace TR 11/16/2022 carotid duplex mild bilateral carotid artery stenosis 03/08/2021 MPI normal, low risk  She established care with Dr. Large in 2022 at the behest of her PCP after a CAC score was arranged with was elevated.  She was overall asymptomatic.  In 2024 she underwent a coronary CTA revealing a calcium  score of 572, 99th percentile, no significant stenosis per FFR however moved to the distal RCA within the indeterminate zone.  Most recently evaluated by Dr. Large on 04/16/2024, she continued to be very physically active, working out as well as walking her dog, lipids were at goal and she was advised to follow up in 1 year.   Evaluated in the emergency department on 07/16/2024 for chest pain, EKG normal, troponins were negative, chest x-ray was negative, she was advised to follow-up with cardiology.  She presents today for follow-up after recent ED visit as outlined above.  On the day she presented to the emergency department she woke up with left-sided chest pain, almost near the axilla area, pain was last send by holding her arm to her chest--she also mentions she had been starting some weight training classes and feel that might have been an exacerbating factor.  She did not have any other anginal type symptoms.  She is remaining very physically active exercising at least 6 days a week.  Her PCP is started her on metformin for  prediabetes. She denies chest pain, palpitations, dyspnea, pnd, orthopnea, n, v, dizziness, syncope, edema, weight gain, or early satiety.    ROS: Review of Systems  All other systems reviewed and are negative.    Studies Reviewed      Cardiac Studies & Procedures   ______________________________________________________________________________________________   STRESS TESTS  PCV MYOCARDIAL PERFUSION WITH LEXISCAN  03/08/2021  Narrative Lexiscan  Tetrofosmin stress test 03/08/2021: Lexiscan  nuclear stress test performed using 1-day protocol. Normal myocardial perfusion. Stress LVEF 66%. Low risk study.   ECHOCARDIOGRAM  PCV ECHOCARDIOGRAM COMPLETE 02/22/2021  Narrative Echocardiogram 02/22/2021: Normal LV systolic function with visual EF 60-65%. Left ventricle cavity is normal in size. Mild left ventricular hypertrophy. Normal global wall motion. Normal diastolic filling pattern, normal LAP. Mild tricuspid regurgitation. No evidence of pulmonary hypertension. No prior study for comparison.      CT SCANS  CT CORONARY MORPH W/CTA COR W/SCORE 02/21/2023  Addendum 03/03/2023  1:46 AM ADDENDUM REPORT: 03/03/2023 01:43  EXAM: OVER-READ INTERPRETATION  CT CHEST  The following report is an over-read performed by radiologist Dr. Suzen Dials of [facility] on 03/03/2023. This over-read does not include interpretation of cardiac or coronary anatomy or pathology. The coronary calcium  score/coronary CTA interpretation by the cardiologist is attached.  COMPARISON:  Jan 26, 2021  FINDINGS: Cardiovascular: There are no significant extracardiac vascular findings.  Mediastinum/Nodes: There are no enlarged lymph nodes within the visualized mediastinum.  Lungs/Pleura: There is no pleural effusion. Mild lingular linear scarring and/or atelectasis is noted.  Upper abdomen: No significant findings in  the visualized upper abdomen.  Musculoskeletal/Chest wall: No  chest wall mass or suspicious osseous findings within the visualized chest.  IMPRESSION: No significant extracardiac findings within the visualized chest.   Electronically Signed By: Suzen Dials M.D. On: 03/03/2023 01:43  Narrative HISTORY: Chest pain, nonspecific  EXAM: Cardiac/Coronary  CT  TECHNIQUE: The patient was scanned on a [employer].  PROTOCOL: A 120 kV prospective scan was triggered in the descending thoracic aorta at 111 HU's. Axial non-contrast 3 mm slices were carried out through the heart. The data set was analyzed on a dedicated work station and scored using the Agatston method. Gantry rotation speed was 250 msecs and collimation was .6 mm. No IV beta blockade but 0.8 mg of sl NTG was given. The 3D data set was reconstructed in 5% intervals of the 35-75% of the R-R cycle. Diastolic phases were analyzed on a dedicated work station using MPR, MIP and VRT modes. The patient received 95mL OMNIPAQUE  IOHEXOL  350 MG/ML SOLN of contrast.  FINDINGS: Image quality: Average.  Artifact: Limited.  Coronary artery calcification score:  Left main: 14  Left anterior descending artery: 241  Left circumflex artery: 13.7  Right coronary artery: 304  Total coronary calcium  score of 572, places the patient at the 99th percentile for age and sex matched control.  Coronary arteries: Normal coronary origins.  Right dominance.  Left Main Coronary Artery: Normal caliber vessel, originates from the left coronary cusp, bifurcates to form a left anterior descending artery and a left circumflex artery. Mild (25-49%) stenosis at distal segment due to calcified plaque and extends to ostial LAD.  Left Anterior Descending Coronary Artery: Normal caliber vessel, wraps/reaches the apex, gives off 2 patent diagonal branches. Mild (25-49%) stenosis at ostial segment due to calcified plaque. Moderate (50-69%) proximal / mid segment due to mixed  (but predominantly calcified) plaque. Mid to apical segments are patent.  Left Circumflex Artery: Normal caliber vessel, non-dominant, travels within the atrioventricular groove, gives off 3 patent obtuse marginal branches. Minimal calcified plaque within proximal/mid LCx otherwise no significant disease.  Right Coronary Artery: Dominant vessel, originates from the right coronary cusp, terminates as a PDA and right posterolateral branch. Mild (25-49%) stenosis at proximal segment due to calcified plaque. Moderate (50-69%) stenosis at proximal / mid segment due to mixed plaque extends about 27mm in length. Distal RCA, PDA, and PLB are patent.  Left Atrium: Grossly normal in size with no left atrial appendage filling defect.  Left Ventricle: Grossly normal in size. There are no stigmata of prior infarction. There is no abnormal filling defect.  Pulmonary arteries: Normal in size without proximal filling defect.  Pulmonary veins: Normal pulmonary venous drainage.  Aorta: Normal size, 27.6 mm at the mid ascending aorta (level of the PA bifurcation) measured double oblique. No calcifications. No dissection.  Pericardium: Normal thickness with no significant effusion or calcium  present.  Cardiac valves: The aortic valve is trileaflet without significant calcification. The mitral valve is normal structure without significant calcification.  Extra-cardiac findings: See attached radiology report for non-cardiac structures.  IMPRESSION: 1. Total coronary calcium  score of 572. This was 99th percentile for age and sex matched control.  2. Normal coronary origin with right dominance.  3. CAD-RADS = 3.  Left Main: Mild (25-49%) stenosis at distal segment due to calcified plaque and extends to ostial LAD.  LAD: Mild (25-49%) stenosis at ostial segment due to calcified plaque. Moderate (50-69%) proximal / mid segment due to mixed (but predominantly calcified) plaque.  LCX:  Minimal calcified plaque within proximal/mid LCx otherwise patent.  RCA: Mild (25-49%) stenosis at proximal segment due to calcified plaque. Moderate (50-69%) stenosis at proximal / mid segment due to mixed plaque.  4. Study is sent for CT-FFR and findings will be performed and reported separately.  RECOMMENDATIONS:  Consider symptom-guided anti-ischemic pharmacotherapy as well as risk factor modification per guideline directed care. Additional analysis with CT FFR will be submitted.  Electronically Signed: By: Madonna Large D.O. On: 02/22/2023 23:53     ______________________________________________________________________________________________      Risk Assessment/Calculations           Physical Exam VS:  BP 124/60   Pulse (!) 58   Ht 5' 4 (1.626 m)   Wt 155 lb 9.6 oz (70.6 kg)   SpO2 98%   BMI 26.71 kg/m        Wt Readings from Last 3 Encounters:  08/01/24 155 lb 9.6 oz (70.6 kg)  07/16/24 153 lb (69.4 kg)  04/16/24 151 lb (68.5 kg)    GEN: Well nourished, well developed in no acute distress NECK: No JVD; No carotid bruits CARDIAC: RRR, no murmurs, rubs, gallops RESPIRATORY:  Clear to auscultation without rales, wheezing or rhonchi  ABDOMEN: Soft, non-tender, non-distended EXTREMITIES:  No edema; No deformity   ASSESSMENT AND PLAN CAD - coronary CT calcium  score 572, 99th percentile, FFR revealed no significant stenosis however CT FFR mid distal RCA within the indeterminant zone. Stable with no anginal symptoms. No indication for ischemic evaluation.  We discussed risk factors for coronary artery disease, she is stopped smoking 5 years ago, she is physically active, her blood pressure is at goal as is her cholesterol.  Continue aspirin  81 mg daily.  Will send in nitroglycerin  as needed discussed how and when to uses as well as emergency room precautions.  Dyslipidemia -LDL is very well-controlled at 43, continue Crestor  20 mg daily, Zetia  10 mg daily.  We did  discuss checking LP(a) and the APO B however she feels like she was enrolled in some sort of trial with her PCP and was told that genetics was not a factor.  Prior tobacco abuse-complete cessation x 5 years.  Hypertension-blood pressure is controlled at 124/60, continue Norvasc  5 mg daily, HCTZ 25 mg daily, losartan  50 mg daily.  Carotid artery stenosis-mild per most recent imaging, repeat duplex is already been arranged for around July 2025, lipids are at goal and she is on aspirin .        Dispo: Nitroglycerin  as needed, keep recall with Dr. Large in July 2026.  Signed, Delon JAYSON Hoover, NP  "

## 2024-08-01 ENCOUNTER — Encounter: Payer: Self-pay | Admitting: Cardiology

## 2024-08-01 ENCOUNTER — Ambulatory Visit: Attending: Cardiology | Admitting: Cardiology

## 2024-08-01 VITALS — BP 124/60 | HR 58 | Ht 64.0 in | Wt 155.6 lb

## 2024-08-01 DIAGNOSIS — I6523 Occlusion and stenosis of bilateral carotid arteries: Secondary | ICD-10-CM | POA: Diagnosis not present

## 2024-08-01 DIAGNOSIS — Z87891 Personal history of nicotine dependence: Secondary | ICD-10-CM

## 2024-08-01 DIAGNOSIS — I2510 Atherosclerotic heart disease of native coronary artery without angina pectoris: Secondary | ICD-10-CM

## 2024-08-01 DIAGNOSIS — I10 Essential (primary) hypertension: Secondary | ICD-10-CM

## 2024-08-01 DIAGNOSIS — E782 Mixed hyperlipidemia: Secondary | ICD-10-CM | POA: Diagnosis not present

## 2024-08-01 MED ORDER — NITROGLYCERIN 0.4 MG SL SUBL: 0.4000 mg | SUBLINGUAL_TABLET | SUBLINGUAL | 3 refills | Status: AC | PRN

## 2024-08-01 NOTE — Patient Instructions (Signed)
 Thank you for choosing [facility]!     Medication Instructions:  Take Nitroglycerin  as needed for chest pain. Place 1 tablet (0.4 mg total) under the tongue every 5 (five) minutes as needed for chest pain.  *If you need a refill on your cardiac medications before your next appointment, please call your pharmacy*   Lab Work: No medication changes were made during today's visit.  If you have labs (blood work) drawn today and your tests are completely normal, you will receive your results only by: MyChart Message (if you have MyChart) OR A paper copy in the mail If you have any lab test that is abnormal or we need to change your treatment, we will call you to review the results.   Testing/Procedures: No procedures were ordered during today's visit.   Your next appointment:  A letter will be mailed to you as a reminder to call the office for your next follow up appointment.      Provider:   Madonna Large, DO     Follow-Up: At [facility], you and your health needs are our priority.  As part of our continuing mission to provide you with exceptional heart care, we have created designated Provider Care Teams.  These Care Teams include your primary Cardiologist (physician) and Advanced Practice Providers (APPs -  Physician Assistants and Nurse Practitioners) who all work together to provide you with the care you need, when you need it. We recommend signing up for the patient portal called MyChart.  Sign up information is provided on this After Visit Summary.  MyChart is used to connect with patients for Virtual Visits (Telemedicine).  Patients are able to view lab/test results, encounter notes, upcoming appointments, etc.  Non-urgent messages can be sent to your provider as well.   To learn more about what you can do with MyChart, go to [email or web address].

## 2024-08-28 ENCOUNTER — Encounter: Payer: Self-pay | Admitting: Cardiology

## 2024-08-28 MED ORDER — ROSUVASTATIN CALCIUM 20 MG PO TABS: 20.0000 mg | ORAL_TABLET | Freq: Every day | ORAL | 3 refills | Status: AC

## 2024-09-09 DIAGNOSIS — Z713 Dietary counseling and surveillance: Secondary | ICD-10-CM | POA: Diagnosis not present

## 2024-09-09 DIAGNOSIS — Z6826 Body mass index (BMI) 26.0-26.9, adult: Secondary | ICD-10-CM | POA: Diagnosis not present
